# Patient Record
Sex: Female | Born: 1975 | Race: White | Hispanic: No | Marital: Single | Smoking: Never smoker
Health system: Southern US, Community
[De-identification: ages and names within clinical notes are randomized; demographics above are authoritative.]

## PROBLEM LIST (undated history)

## (undated) ENCOUNTER — Inpatient Hospital Stay: Payer: Self-pay

## (undated) DIAGNOSIS — I10 Essential (primary) hypertension: Secondary | ICD-10-CM

## (undated) DIAGNOSIS — T4145XA Adverse effect of unspecified anesthetic, initial encounter: Secondary | ICD-10-CM

## (undated) DIAGNOSIS — B009 Herpesviral infection, unspecified: Secondary | ICD-10-CM

## (undated) DIAGNOSIS — J45909 Unspecified asthma, uncomplicated: Secondary | ICD-10-CM

## (undated) DIAGNOSIS — T8859XA Other complications of anesthesia, initial encounter: Secondary | ICD-10-CM

## (undated) DIAGNOSIS — Z87728 Personal history of other specified (corrected) congenital malformations of nervous system and sense organs: Secondary | ICD-10-CM

## (undated) HISTORY — DX: Unspecified asthma, uncomplicated: J45.909

## (undated) HISTORY — PX: NASAL SINUS SURGERY: SHX719

## (undated) HISTORY — DX: Personal history of other specified (corrected) congenital malformations of nervous system and sense organs: Z87.728

## (undated) HISTORY — DX: Herpesviral infection, unspecified: B00.9

---

## 2008-06-22 ENCOUNTER — Encounter: Payer: Self-pay | Admitting: Nurse Practitioner

## 2008-07-07 ENCOUNTER — Encounter: Payer: Self-pay | Admitting: Nurse Practitioner

## 2008-08-01 ENCOUNTER — Ambulatory Visit: Payer: Self-pay | Admitting: Family Medicine

## 2008-08-17 ENCOUNTER — Ambulatory Visit: Payer: Self-pay | Admitting: Sports Medicine

## 2008-08-17 DIAGNOSIS — M2170 Unequal limb length (acquired), unspecified site: Secondary | ICD-10-CM

## 2008-08-17 DIAGNOSIS — R269 Unspecified abnormalities of gait and mobility: Secondary | ICD-10-CM | POA: Insufficient documentation

## 2008-08-17 DIAGNOSIS — M25569 Pain in unspecified knee: Secondary | ICD-10-CM | POA: Insufficient documentation

## 2008-08-20 ENCOUNTER — Encounter: Payer: Self-pay | Admitting: Sports Medicine

## 2008-09-08 ENCOUNTER — Ambulatory Visit: Payer: Self-pay | Admitting: Sports Medicine

## 2008-11-18 ENCOUNTER — Telehealth (INDEPENDENT_AMBULATORY_CARE_PROVIDER_SITE_OTHER): Payer: Self-pay | Admitting: *Deleted

## 2011-09-26 ENCOUNTER — Encounter: Payer: Self-pay | Admitting: Sports Medicine

## 2011-09-26 ENCOUNTER — Ambulatory Visit (INDEPENDENT_AMBULATORY_CARE_PROVIDER_SITE_OTHER): Payer: BC Managed Care – PPO | Admitting: Sports Medicine

## 2011-09-26 VITALS — BP 136/81 | HR 64 | Ht 64.0 in | Wt 130.0 lb

## 2011-09-26 DIAGNOSIS — M25552 Pain in left hip: Secondary | ICD-10-CM | POA: Insufficient documentation

## 2011-09-26 DIAGNOSIS — R269 Unspecified abnormalities of gait and mobility: Secondary | ICD-10-CM

## 2011-09-26 DIAGNOSIS — M25559 Pain in unspecified hip: Secondary | ICD-10-CM

## 2011-09-26 MED ORDER — NITROGLYCERIN 0.2 MG/HR TD PT24: MEDICATED_PATCH | TRANSDERMAL | Status: DC

## 2011-09-26 NOTE — Patient Instructions (Addendum)
Thank you for coming in today. Exercises: 1) Hip Lateral abductors                              15 reps 3 sets 2x a day 2) Hip flexed 30 deg lateral abductors         15 reps 3 sets 2x a day 3) Front step ups                                         15 reps 3 sets 2x a day 3) Side steps ups     15 reps 3 sets 2x a day 4) Cross over step ups   15 reps 3 sets 2x a day  Wear those insoles  Nitroglycerin Protocol   Apply 1/4 nitroglycerin patch to affected area daily.  Change position of patch within the affected area every 24 hours.  You may experience a headache during the first 1-2 weeks of using the patch, these should subside.  If you experience headaches after beginning nitroglycerin patch treatment, you may take your preferred over the counter pain reliever.  Another side effect of the nitroglycerin patch is skin irritation or rash related to patch adhesive.  Please notify our office if you develop more severe headaches or rash, and stop the patch.  Tendon healing with nitroglycerin patch may require 12 to 24 weeks depending on the extent of injury.  Men should not use if taking Viagra, Cialis, or Levitra.   Do not use if you have migraines or rosacea.    Come back in 4-6 weeks

## 2011-09-26 NOTE — Assessment & Plan Note (Signed)
Related to strain of tensor fascia insertion.  Additionally abnormal leg length is contributing.  Plan:  Nitroglycerin protocol Hip abductor strengthening Graduated return to running.  Followup in 4-6 weeks

## 2011-09-26 NOTE — Assessment & Plan Note (Signed)
We may have overcorrected left leg  Try wedge insole and in office running gait is more normal with less turn in  Line drills

## 2011-09-26 NOTE — Progress Notes (Signed)
Sydney Lynn is a 36 y.o. female who presents to [facility] today for left iliac crest pain. Patient is a 36 year old runner with a history of leg length discrepancy (left 0.5 cm shorter) who presented with iliac crest pain about 5 weeks ago. She denies any specific injury. However her pain developed after adjusting stride length and doing speed work in July. She notes her pain is worse with running especially up and down hills. She is no longer running her typical 30 miles per week.  She has continued to use a leg length correction in her shoes.  She feels well otherwise without any weakness numbness or radiating pain.   PMH reviewed. Leg length discrepancy History  Substance Use Topics  . Smoking status: Never Smoker   . Smokeless tobacco: Never Used  . Alcohol Use: Not on file   ROS as above otherwise neg   Exam:  BP 136/81  Pulse 64  Ht 5\' 4"  (1.626 m)  Wt 130 lb (58.968 kg)  BMI 22.31 kg/m2 Gen: Well NAD MSK: Left hip. Normal-appearing. Tender along the iliac crest Center tensor fascia lata origin.  Normal hip range of motion.  Strength is diminished to abduction bilaterally.  Normal hip abduction strength and flexion strength.   0.5 cm leg length discrepancy left shorter.  Feet: Relatively normal appearing left foot with bunion. No significant arch collapse. Normal heel varus on toe standing bilaterally.   Musculoskeletal ultrasound: Iliac crest.  Small area of hypo-echogenicity seen near the insertion of the tensor fascia on the iliac crest in longitudinal and transverse views. Increased Doppler flow within the hypo-echogenicity  Running gait is improved with changing to a wedged insole

## 2011-11-07 ENCOUNTER — Ambulatory Visit: Payer: BC Managed Care – PPO | Admitting: Sports Medicine

## 2011-11-12 ENCOUNTER — Encounter: Payer: Self-pay | Admitting: Sports Medicine

## 2011-11-12 ENCOUNTER — Ambulatory Visit (INDEPENDENT_AMBULATORY_CARE_PROVIDER_SITE_OTHER): Payer: BC Managed Care – PPO | Admitting: Sports Medicine

## 2011-11-12 VITALS — BP 122/76 | HR 64 | Ht 64.0 in | Wt 130.0 lb

## 2011-11-12 DIAGNOSIS — M25552 Pain in left hip: Secondary | ICD-10-CM

## 2011-11-12 DIAGNOSIS — M25559 Pain in unspecified hip: Secondary | ICD-10-CM

## 2011-11-12 DIAGNOSIS — R269 Unspecified abnormalities of gait and mobility: Secondary | ICD-10-CM

## 2011-11-12 DIAGNOSIS — M2170 Unequal limb length (acquired), unspecified site: Secondary | ICD-10-CM

## 2011-11-12 NOTE — Assessment & Plan Note (Signed)
She has corrected the dynamic genu valgus

## 2011-11-12 NOTE — Assessment & Plan Note (Signed)
Much improved  Cont program for 6 more weeks  If not resolved rtc at that point - otherwise cont some HEP for hip strength

## 2011-11-12 NOTE — Progress Notes (Signed)
Patient ID: Sydney Lynn, female   DOB: 07/14/1975, 36 y.o.   MRN: [id number]  SUBJECTIVE: Sydney Lynn is a 36 y.o. female with unremarkable PMHx who was last seen in [facility] on 19-Sept-2013 for LEFT iliac crest pain, ultimately diagnosed with Tensor Fascia Lata strain and discharged with home hip rehabilitation strengthening exercises, nitro glycerin protocol and gradual return to running.  She reports using nitroglycerin patches for approx 2 weeks but having to stop secondary to localized erythema, scaling and intense pruritis.  However, she feels that she has improved to approx 80-85% of her pre-injury level of activity / function.  She reports doing home exercises faithfully (focusing on hip abduction, adduction strengthening) and gradually returned to running.  She was also given green, temporary orthotic inserts which she has found beneficial as well.  Remarks that at the end of her runs she now feels as if her knees are knocking / rubbing together.    Now able to run 5 miles on treadmill, 2 consecutive days with little pain (only occasional during the end of runs @ TFL insertion).  She has had difficulty with training outside, especially given [place] and only tried running 4 miles outside on two different occasions.  Both caused pain and required 2 days off after 1 day of running.  Pre-injury she ran approx 30-35 miles per week, long run of 8 and ran 10Ks and half-marathons.  OBJECTIVE: Vital signs as noted above. Appearance: Well appearing, fit female, resting comfortably  Bilateral Hip / Knee exam: Inspection - No gross abnormalities or erythema, edema, echhymosis in bilateral hips/knees AROM, PROM - Symmetrica and appropriate ROM in knee flexion, extension and in hip flexion, extension, IR, ER, abduction, adduction. Muscle Strength -  5/5 strength in all of the above including hip abduction at terminal abduction (70 degrees), 30 degrees abduction knee straight (some  pain @ TFL) and 30 degrees knee bent (some pain @ TFL insertion, unchanged compared to straight).  5/5 strength in bilateral knees and all other hip ROM. Palpation - Some localized tenderness along inferior lip of lateral portion of anterior 1/3 of iliac crest on LEFT side, worse when palpating during resisted hip abduction.  Non-tender to ASIS, PSIS, apex and distal 2/3 of iliac crest  Special Tests - Negative Faber's test bilaterally  Standing foot exam: Feet appear symmetric and appropriate;  Good medial longitudinal and transverse arches bilaterally.  No appreciable hindfoot valgus or achilles abnormalities.  Distance between toes symmetric and appropriate bilaterally.  Gait evaluation: Pt runs well with appropriate time in stance and swing phase bilaterally.  Neutral footstrike.  No appreciable dynamic genu valgus or trendelenburg on the LEFT leg which is a significant improvement compared to prior visit.  ASSESSMENT: 1. Tensor Fascia Lata Strain, LEFT, improving   PLAN: Pt with history and exam above to support improving, healing LEFT Tensor Fascia Latta strain.  She will continue gradual return to running with slow return to running on hills / uneven surfaces and outside.  She will continue hip abduction/adduction strengthening 1-2 x per week when she is running competitively.  Cleared to return to running with increased mileage on flat surface (tracks or treadmills) and will gradually return to hills with decreased mileage, pace initially and increase as tolerated.  She will follow-up as needed for this issue or any other problems that arise.    -- Kenney Houseman, MS IV

## 2011-11-12 NOTE — Assessment & Plan Note (Signed)
Continue to use lift but not the heel taper

## 2013-12-17 ENCOUNTER — Encounter: Payer: Self-pay | Admitting: Sports Medicine

## 2013-12-17 ENCOUNTER — Ambulatory Visit (INDEPENDENT_AMBULATORY_CARE_PROVIDER_SITE_OTHER): Payer: BC Managed Care – PPO | Admitting: Sports Medicine

## 2013-12-17 VITALS — BP 112/80 | HR 69 | Ht 64.0 in | Wt 130.0 lb

## 2013-12-17 DIAGNOSIS — M545 Low back pain, unspecified: Secondary | ICD-10-CM | POA: Insufficient documentation

## 2013-12-17 DIAGNOSIS — M2170 Unequal limb length (acquired), unspecified site: Secondary | ICD-10-CM | POA: Diagnosis not present

## 2013-12-17 DIAGNOSIS — R269 Unspecified abnormalities of gait and mobility: Secondary | ICD-10-CM | POA: Diagnosis not present

## 2013-12-17 NOTE — Assessment & Plan Note (Signed)
We will work toward biomechanical correction  If not improving ck LS spine films and try flexeril  Reck 8 yrs

## 2013-12-17 NOTE — Progress Notes (Signed)
Patient ID: Sydney Lynn, female   DOB: 07/28/1975, 38 y.o.   MRN: [id number]  1 yr ago lifting had low back pain to left lumbar Dx of spina bifida in childhood Prior to injury rare LBP but not like this  PE teacher elementary  Runs ~ 25 mpw Speed and longer distance cause pain  Hx of hip problem and LLI when we last saw her  Exam Athletic F/ NAD BP 112/80 mmHg  Pulse 69  Ht 5\' 4"  (1.626 m)  Wt 130 lb (58.968 kg)  BMI 22.30 kg/m2  Hip Full ROM bilat/ weakness of hip abductios L > R/ other MM strong  Back ; Full extension with mild pain to left SIJ Flexion full with no pain  Lat bend/ rotation normal SIJ move freely Neg Faber/ FADIR No discogenic findings w neg SLR  HS moderate weakness on left  LLI with left 1 to 1.5 cm short  Running gait with 5 deg lean of trunk to left/ otherwise exc form (even w lift)

## 2013-12-17 NOTE — Assessment & Plan Note (Signed)
Work toward [employer]rection  Start with core exercises and hip exercises

## 2013-12-17 NOTE — Assessment & Plan Note (Signed)
Cont to use lift in all shoes

## 2014-02-15 ENCOUNTER — Ambulatory Visit: Payer: BC Managed Care – PPO | Admitting: Sports Medicine

## 2014-07-07 ENCOUNTER — Ambulatory Visit (INDEPENDENT_AMBULATORY_CARE_PROVIDER_SITE_OTHER): Payer: BC Managed Care – PPO | Admitting: Obstetrics and Gynecology

## 2014-07-07 ENCOUNTER — Encounter: Payer: Self-pay | Admitting: Obstetrics and Gynecology

## 2014-07-07 VITALS — BP 127/72 | HR 68 | Wt 131.9 lb

## 2014-07-07 DIAGNOSIS — Z36 Encounter for antenatal screening of mother: Secondary | ICD-10-CM

## 2014-07-07 DIAGNOSIS — Z91030 Bee allergy status: Secondary | ICD-10-CM

## 2014-07-07 DIAGNOSIS — O3680X1 Pregnancy with inconclusive fetal viability, fetus 1: Secondary | ICD-10-CM

## 2014-07-07 DIAGNOSIS — Z369 Encounter for antenatal screening, unspecified: Secondary | ICD-10-CM

## 2014-07-07 DIAGNOSIS — Z91038 Other insect allergy status: Secondary | ICD-10-CM

## 2014-07-07 LAB — OB RESULTS CONSOLE ABO/RH: RH Type: NEGATIVE

## 2014-07-08 ENCOUNTER — Encounter: Payer: Self-pay | Admitting: Obstetrics and Gynecology

## 2014-07-08 LAB — ABO AND RH: RH TYPE: NEGATIVE

## 2014-07-08 LAB — CBC WITH DIFFERENTIAL/PLATELET
BASOS ABS: 0 10*3/uL (ref 0.0–0.2)
BASOS: 0 %
EOS (ABSOLUTE): 0.1 10*3/uL (ref 0.0–0.4)
EOS: 1 %
HEMATOCRIT: 40.3 % (ref 34.0–46.6)
HEMOGLOBIN: 13.8 g/dL (ref 11.1–15.9)
IMMATURE GRANULOCYTES: 0 %
Immature Grans (Abs): 0 10*3/uL (ref 0.0–0.1)
LYMPHS: 23 %
Lymphocytes Absolute: 1.8 10*3/uL (ref 0.7–3.1)
MCH: 31.7 pg (ref 26.6–33.0)
MCHC: 34.2 g/dL (ref 31.5–35.7)
MCV: 93 fL (ref 79–97)
MONOCYTES: 7 %
Monocytes Absolute: 0.6 10*3/uL (ref 0.1–0.9)
NEUTROS PCT: 69 %
Neutrophils Absolute: 5.5 10*3/uL (ref 1.4–7.0)
PLATELETS: 294 10*3/uL (ref 150–379)
RBC: 4.35 x10E6/uL (ref 3.77–5.28)
RDW: 12.6 % (ref 12.3–15.4)
WBC: 8.1 10*3/uL (ref 3.4–10.8)

## 2014-07-08 LAB — ANTIBODY SCREEN: ANTIBODY SCREEN: NEGATIVE

## 2014-07-08 LAB — URINE CULTURE

## 2014-07-08 LAB — RUBELLA SCREEN: RUBELLA: 1.03 {index} (ref >0.99)

## 2014-07-08 LAB — URINALYSIS, ROUTINE W REFLEX MICROSCOPIC
BILIRUBIN UA: NEGATIVE
Glucose, UA: NEGATIVE
KETONES UA: NEGATIVE
Leukocytes, UA: NEGATIVE
NITRITE UA: NEGATIVE
PROTEIN UA: NEGATIVE
RBC UA: NEGATIVE
SPEC GRAV UA: 1.029 (ref 1.005–1.030)
UUROB: 0.2 mg/dL (ref 0.2–1.0)
pH, UA: 7 (ref 5.0–7.5)

## 2014-07-08 LAB — VARICELLA ZOSTER ANTIBODY, IGG: Varicella zoster IgG: 730 index (ref >165)

## 2014-07-08 LAB — HEP, RPR, HIV PANEL
HEP B S AG: NEGATIVE
HIV SCREEN 4TH GENERATION: NONREACTIVE
RPR: NONREACTIVE

## 2014-07-08 MED ORDER — EPINEPHRINE 0.3 MG/0.3ML IJ SOAJ: 0.3000 mg | Freq: Once | INTRAMUSCULAR | Status: DC

## 2014-07-08 NOTE — Addendum Note (Signed)
Addended by: Frankey ShownGLANTON, Calea Hribar C on: 07/08/2014 09:53 AM   Modules accepted: Orders

## 2014-07-08 NOTE — Patient Instructions (Signed)
Pt to return in 1 week for dating and viability scan. F/U in 3 weeks for NOB PE.

## 2014-07-08 NOTE — Progress Notes (Cosign Needed)
[facility] G[facility]  P0 Pregnancy eduction material explained and given. NOB labs ordered. HIV consent form signed. PNV encouraged. Pt encouraged to to take 4 grams of Folic Acid as pt has family history of Spina Bifida (Father and Brother), as well as personal history of Spina Bifida. Pt history also significant for HSV 2, uses Valtrex for outbreaks. Pt is of advanced maternal age. NT to discuss with provider. Pt. To follow up with provider in 3 weeks for NOB physical. Pt to have dating and viability scan in 1 week. All questions answered.

## 2014-07-08 NOTE — Progress Notes (Signed)
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 Will send RX.

## 2014-07-15 ENCOUNTER — Ambulatory Visit: Payer: BC Managed Care – PPO

## 2014-07-15 DIAGNOSIS — O3680X1 Pregnancy with inconclusive fetal viability, fetus 1: Secondary | ICD-10-CM | POA: Diagnosis not present

## 2014-07-28 ENCOUNTER — Ambulatory Visit (INDEPENDENT_AMBULATORY_CARE_PROVIDER_SITE_OTHER): Payer: BC Managed Care – PPO | Admitting: Obstetrics and Gynecology

## 2014-07-28 ENCOUNTER — Encounter: Payer: Self-pay | Admitting: Obstetrics and Gynecology

## 2014-07-28 VITALS — BP 117/72 | HR 70 | Wt 137.0 lb

## 2014-07-28 DIAGNOSIS — O98511 Other viral diseases complicating pregnancy, first trimester: Secondary | ICD-10-CM

## 2014-07-28 DIAGNOSIS — B009 Herpesviral infection, unspecified: Secondary | ICD-10-CM

## 2014-07-28 DIAGNOSIS — Z3481 Encounter for supervision of other normal pregnancy, first trimester: Secondary | ICD-10-CM

## 2014-07-28 DIAGNOSIS — Z113 Encounter for screening for infections with a predominantly sexual mode of transmission: Secondary | ICD-10-CM

## 2014-07-28 DIAGNOSIS — Z3491 Encounter for supervision of normal pregnancy, unspecified, first trimester: Secondary | ICD-10-CM

## 2014-07-28 DIAGNOSIS — Z8776 Personal history of (corrected) congenital malformations of integument, limbs and musculoskeletal system: Secondary | ICD-10-CM

## 2014-07-28 DIAGNOSIS — Z87728 Personal history of other specified (corrected) congenital malformations of nervous system and sense organs: Secondary | ICD-10-CM

## 2014-07-28 LAB — POCT URINALYSIS DIPSTICK
Bilirubin, UA: NEGATIVE
Glucose, UA: NEGATIVE
Ketones, UA: NEGATIVE
Leukocytes, UA: NEGATIVE
NITRITE UA: NEGATIVE
Protein, UA: NEGATIVE
RBC UA: NEGATIVE
Spec Grav, UA: 1.01
UROBILINOGEN UA: NEGATIVE
pH, UA: 8

## 2014-07-28 MED ORDER — VALACYCLOVIR HCL 1 G PO TABS: 1000.0000 mg | ORAL_TABLET | Freq: Every day | ORAL | Status: DC

## 2014-07-28 MED ORDER — ALBUTEROL SULFATE HFA 108 (90 BASE) MCG/ACT IN AERS: 2.0000 | INHALATION_SPRAY | RESPIRATORY_TRACT | Status: DC | PRN

## 2014-07-29 NOTE — Progress Notes (Addendum)
Subjective:    Sydney Lynn is being seen today for her first obstetrical visit.  This is a planned pregnancy. She is a G1P0 at [redacted]w[redacted]d gestation by Patient's last menstrual period was 05/21/2014.  Her obstetrical history is significant for advanced maternal age and h/o spina bifida (type 1). Relationship with FOB: spouse, living together. Patient does intend to breast feed. Pregnancy history fully reviewed.  Menstrual History: OB History    Gravida Para Term Preterm AB TAB SAB Ectopic Multiple Living   1               Menarche age: 64 Patient's last menstrual period was 05/21/2014.  Denies h/o abnormal pap smears. Last pap smear normal, 2015.     Past Medical History  Diagnosis Date  . Asthma     Exercise Induced  . 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 HSV-2 infection   . H/O spina bifida     mild    Past Surgical History  Procedure Laterality Date  . Nasal sinus surgery      Family History  Problem Relation Age of Onset  . Hypertension Mother   . Spina bifida Father     mild  . Spina bifida Brother     Type 1    History   Social History  . Marital Status: Single    Spouse Name: N/A  . Number of Children: N/A  . Years of Education: N/A   Occupational History  . Not on file.   Social History Main Topics  . Smoking status: Never Smoker   . Smokeless tobacco: Never Used  . Alcohol Use: No  . Drug Use: No  . Sexual Activity: Yes    Birth Control/ Protection: None     Comment: Pregnant   Other Topics Concern  . Not on file   Social History Narrative    Current Outpatient Prescriptions on File Prior to Visit  Medication Sig Dispense Refill  . cetirizine (ZYRTEC) 5 MG tablet Take 5 mg by mouth daily.    Marland Kitchen EPINEPHrine (EPIPEN 2-PAK) 0.3 mg/0.3 mL IJ SOAJ injection Inject 0.3 mLs (0.3 mg total) into the muscle once. 1 Device 3  . triamcinolone (NASACORT) 55 MCG/ACT nasal inhaler Inhale 2 puffs into the lungs daily.        Review of Systems  General:Not Present- Fever, Weight Loss and Weight Gain. Skin:Not Present- Rash. HEENT:Not Present- Blurred Vision, Headache and Bleeding Gums. Respiratory:Not Present- Difficulty Breathing. Breast:Not Present- Breast Mass. Cardiovascular:Not Present- Chest Pain, Elevated Blood Pressure, Fainting / Blacking Out and Shortness of Breath. Gastrointestinal:Not Present- Abdominal Pain, Constipation, Nausea and Vomiting. Female Genitourinary:Not Present- Frequency, Painful Urination, Pelvic Pain, Vaginal Bleeding, Vaginal Discharge, Contractions, regular, Fetal Movements Decreased, Urinary Complaints and Vaginal Fluid. Musculoskeletal:Not Present- Back Pain and Leg Cramps. Neurological:Not Present- Dizziness. Psychiatric:Not Present- Depression.    Objective:   Blood pressure 117/72, pulse 70, weight 137 lb (62.143 kg), last menstrual period 05/21/2014.  General Appearance:    Alert, cooperative, no distress, appears stated age  Head:    Normocephalic, without obvious abnormality, atraumatic  Eyes:    PERRL, conjunctiva/corneas clear, EOM's intact, both eyes  Ears:    Normal external ear canals, both ears  Nose:   Nares normal, septum midline, mucosa normal, no drainage or sinus tenderness  Throat:   Lips, mucosa, and tongue normal; teeth and gums normal  Neck:   Supple, symmetrical, trachea midline, no adenopathy; thyroid: no enlargement/tenderness/nodules; no  carotid bruit or JVD  Back:     Symmetric, no curvature, ROM normal, no CVA tenderness  Lungs:     Clear to auscultation bilaterally, respirations unlabored  Chest Wall:    No tenderness or deformity   Heart:    Regular rate and rhythm, S1 and S2 normal, no murmur, rub or gallop  Breast Exam:    No tenderness, masses, or nipple abnormality  Abdomen:     Soft, non-tender, bowel sounds active all four quadrants, no masses, no organomegaly. FHT 162 bpm.  Genitalia:    Pelvic:external genitalia  normal, vagina with lesions, discharge, or tenderness, rectovaginal septum       normal. Cervix normal in appearance, no cervical motion tenderness, no adnexal masses or tenderness.     Pregnancy positive findings: uterine enlargement:  FH 11 wk size, nontender.   Rectal:    Normal external sphincter.  No hemorrhoids appreciated. Internal exam not done.   Extremities:   Extremities normal, atraumatic, no cyanosis or edema  Pulses:   2+ and symmetric all extremities  Skin:   Skin color, texture, turgor normal, no rashes or lesions  Lymph nodes:   Cervical, supraclavicular, and axillary nodes normal  Neurologic:   CNII-XII intact, normal strength, sensation and reflexes throughout     Assessment:    Pregnancy at 11 and 0/7 weeks   Advanced maternal age. 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 Prenatal vitamins. Problem list reviewed and updated. Genetic testing discussed.  Patient desires Panorama testing.  Will perform today.  Role of ultrasound in pregnancy discussed; fetal survey: to order for appropriate gestational age. New OB counseling:  The patient has been given an overview regarding routine prenatal care.  Recommendations regarding diet, weight gain, and exercise in pregnancy were given.  Prenatal testing, optional genetic testing, and ultrasound use in pregnancy were reviewed. Benefits of Breast Feeding were discussed. The patient is encouraged to consider nursing her baby post partum. Desires refill on asthma inhaler.  Refill given.  H/o HSV infection - last outbreak 1-2 years ago.  Takes suppressive therapy.  Desires refill.  Will continue on suppressive therapy while in pregnancy.  H/o spina bifida - has been taking 4 mg folic acid daily since discovery of pregnancy.  Follow up in 4 weeks.    Hildred Laser, MD [facility]

## 2014-08-02 ENCOUNTER — Telehealth: Payer: Self-pay

## 2014-08-02 DIAGNOSIS — N760 Acute vaginitis: Secondary | ICD-10-CM

## 2014-08-02 DIAGNOSIS — Z91030 Bee allergy status: Secondary | ICD-10-CM

## 2014-08-02 DIAGNOSIS — B9689 Other specified bacterial agents as the cause of diseases classified elsewhere: Secondary | ICD-10-CM

## 2014-08-02 LAB — NUSWAB VAGINITIS PLUS (VG+)
ATOPOBIUM VAGINAE: HIGH {score} — AB
BVAB 2: HIGH Score — AB
MEGASPHAERA 1: HIGH {score} — AB

## 2014-08-02 MED ORDER — EPINEPHRINE 0.3 MG/0.3ML IJ SOAJ: 0.3000 mg | Freq: Once | INTRAMUSCULAR | Status: AC

## 2014-08-02 MED ORDER — METRONIDAZOLE 500 MG PO TABS: 500.0000 mg | ORAL_TABLET | Freq: Two times a day (BID) | ORAL | Status: DC

## 2014-08-02 NOTE — Telephone Encounter (Signed)
Pt informed of BV, RX sent in for Flagyl.

## 2014-08-02 NOTE — Telephone Encounter (Signed)
-----   Message from Hildred Laser, MD sent at 08/02/2014 11:32 AM EDT ----- Needs treatment for BV (Flagyl 500 mg BID x 7 days).

## 2014-08-05 ENCOUNTER — Encounter: Payer: Self-pay | Admitting: Obstetrics and Gynecology

## 2014-08-08 ENCOUNTER — Encounter: Payer: Self-pay | Admitting: Obstetrics and Gynecology

## 2014-08-10 ENCOUNTER — Telehealth: Payer: Self-pay

## 2014-08-10 NOTE — Telephone Encounter (Signed)
Pt informed of information below

## 2014-08-10 NOTE — Telephone Encounter (Signed)
-----   Message from Hildred Laser, MD sent at 08/09/2014  2:18 PM EDT ----- Please inform patient of normal Panorama results, female fetus if desires to know sex.

## 2014-08-25 ENCOUNTER — Ambulatory Visit (INDEPENDENT_AMBULATORY_CARE_PROVIDER_SITE_OTHER): Payer: BC Managed Care – PPO | Admitting: Obstetrics and Gynecology

## 2014-08-25 ENCOUNTER — Encounter: Payer: Self-pay | Admitting: Obstetrics and Gynecology

## 2014-08-25 VITALS — BP 110/71 | HR 70 | Wt 139.2 lb

## 2014-08-25 DIAGNOSIS — Z331 Pregnant state, incidental: Secondary | ICD-10-CM

## 2014-08-25 DIAGNOSIS — Z8776 Personal history of (corrected) congenital malformations of integument, limbs and musculoskeletal system: Secondary | ICD-10-CM

## 2014-08-25 DIAGNOSIS — Z87728 Personal history of other specified (corrected) congenital malformations of nervous system and sense organs: Secondary | ICD-10-CM

## 2014-08-25 LAB — POCT URINALYSIS DIPSTICK
Bilirubin, UA: NEGATIVE
GLUCOSE UA: NEGATIVE
Ketones, UA: NEGATIVE
Leukocytes, UA: NEGATIVE
NITRITE UA: NEGATIVE
PH UA: 7.5
PROTEIN UA: NEGATIVE
RBC UA: NEGATIVE
Spec Grav, UA: 1.01
UROBILINOGEN UA: 0.2

## 2014-08-25 NOTE — Progress Notes (Signed)
ROB: Patient doing well, notes complaints of hemorrhoids.  Denies constipation.  Advised on Preparation H, Tucks pads.  RTC in 5 weeks.  Will need serum AFP and anatomy scan next visit. Continue folic acid supplementation for h/o spina bifida.

## 2014-09-28 ENCOUNTER — Other Ambulatory Visit: Payer: Self-pay | Admitting: Obstetrics and Gynecology

## 2014-09-28 ENCOUNTER — Ambulatory Visit (INDEPENDENT_AMBULATORY_CARE_PROVIDER_SITE_OTHER): Payer: BC Managed Care – PPO | Admitting: Obstetrics and Gynecology

## 2014-09-28 ENCOUNTER — Encounter: Payer: Self-pay | Admitting: Obstetrics and Gynecology

## 2014-09-28 ENCOUNTER — Ambulatory Visit: Payer: BC Managed Care – PPO

## 2014-09-28 VITALS — BP 113/67 | HR 66 | Wt 144.9 lb

## 2014-09-28 DIAGNOSIS — Z3493 Encounter for supervision of normal pregnancy, unspecified, third trimester: Secondary | ICD-10-CM | POA: Insufficient documentation

## 2014-09-28 DIAGNOSIS — Z331 Pregnant state, incidental: Secondary | ICD-10-CM

## 2014-09-28 DIAGNOSIS — Z8776 Personal history of (corrected) congenital malformations of integument, limbs and musculoskeletal system: Secondary | ICD-10-CM

## 2014-09-28 DIAGNOSIS — Z87728 Personal history of other specified (corrected) congenital malformations of nervous system and sense organs: Secondary | ICD-10-CM

## 2014-09-28 DIAGNOSIS — Z3492 Encounter for supervision of normal pregnancy, unspecified, second trimester: Secondary | ICD-10-CM | POA: Diagnosis not present

## 2014-09-28 DIAGNOSIS — O09512 Supervision of elderly primigravida, second trimester: Secondary | ICD-10-CM

## 2014-09-28 LAB — POCT URINALYSIS DIPSTICK
BILIRUBIN UA: NEGATIVE
Blood, UA: NEGATIVE
GLUCOSE UA: NEGATIVE
Ketones, UA: NEGATIVE
Leukocytes, UA: NEGATIVE
Nitrite, UA: NEGATIVE
Protein, UA: NEGATIVE
Urobilinogen, UA: 0.2
pH, UA: 6.5

## 2014-09-28 MED ORDER — INFLUENZA VAC SPLIT QUAD 0.5 ML IM SUSY
0.5000 mL | PREFILLED_SYRINGE | Freq: Once | INTRAMUSCULAR | Status: AC
  Administered 2014-09-28: 0.5 mL via INTRAMUSCULAR

## 2014-09-28 NOTE — Progress Notes (Signed)
AFP today. Flu vaccine today.

## 2014-09-28 NOTE — Progress Notes (Signed)
ROB: Doing well. Denies complaints. S/p normal anatomy scan.  For serum AFP today.  Flu vaccine given.  RTC in 4 weeks.

## 2014-10-01 LAB — AFP, SERUM, OPEN SPINA BIFIDA
AFP MoM: 0.98
AFP VALUE AFPOSL: 51.8 ng/mL
Gest. Age on Collection Date: 19.7 weeks
MATERNAL AGE AT EDD: 37.4 a
OSBR Risk 1 IN: 10000
Test Results:: NEGATIVE
WEIGHT: 150 [lb_av]

## 2014-10-25 ENCOUNTER — Ambulatory Visit (INDEPENDENT_AMBULATORY_CARE_PROVIDER_SITE_OTHER): Payer: BC Managed Care – PPO | Admitting: Obstetrics and Gynecology

## 2014-10-25 VITALS — BP 138/77 | HR 76 | Wt 153.5 lb

## 2014-10-25 DIAGNOSIS — Z8776 Personal history of (corrected) congenital malformations of integument, limbs and musculoskeletal system: Secondary | ICD-10-CM

## 2014-10-25 DIAGNOSIS — Z87728 Personal history of other specified (corrected) congenital malformations of nervous system and sense organs: Secondary | ICD-10-CM

## 2014-10-25 DIAGNOSIS — Z6791 Unspecified blood type, Rh negative: Secondary | ICD-10-CM | POA: Insufficient documentation

## 2014-10-25 DIAGNOSIS — Z3492 Encounter for supervision of normal pregnancy, unspecified, second trimester: Secondary | ICD-10-CM

## 2014-10-25 DIAGNOSIS — O26899 Other specified pregnancy related conditions, unspecified trimester: Secondary | ICD-10-CM

## 2014-10-25 DIAGNOSIS — O360120 Maternal care for anti-D [Rh] antibodies, second trimester, not applicable or unspecified: Secondary | ICD-10-CM

## 2014-10-25 LAB — POCT URINALYSIS DIP (MANUAL ENTRY)
BILIRUBIN UA: NEGATIVE
Blood, UA: NEGATIVE
GLUCOSE UA: NEGATIVE
Ketones, POC UA: NEGATIVE
LEUKOCYTES UA: NEGATIVE
Nitrite, UA: NEGATIVE
PH UA: 7
Protein Ur, POC: NEGATIVE
Spec Grav, UA: 1.01
Urobilinogen, UA: 0.2

## 2014-10-25 NOTE — Progress Notes (Signed)
ROB: Denies complaints.  Notes that sometimes when running, feels groin pain.  Eases after cessation of run.  May be round ligament pain and/or pain related to exercise. Advised on pregnancy band, staying hydrated, Tylenol prn. Serum AFP neg. RTC in 4 weeks for next visit.  For glucola, Tdap, Rhogam, and blood consent at that time.  .Marland Kitchen

## 2014-10-25 NOTE — Patient Instructions (Signed)
Round Ligament Pain  The round ligament is a cord of muscle and tissue that helps to support the uterus. It can become a source of pain during pregnancy if it becomes stretched or twisted as the baby grows. The pain usually begins in the second trimester of pregnancy, and it can come and go until the baby is delivered. It is not a serious problem, and it does not cause harm to the baby.  Round ligament pain is usually a short, sharp, and pinching pain, but it can also be a dull, lingering, and aching pain. The pain is felt in the lower side of the abdomen or in the groin. It usually starts deep in the groin and moves up to the outside of the hip area. Pain can occur with:   A sudden change in position.   Rolling over in bed.   Coughing or sneezing.   Physical activity.  HOME CARE INSTRUCTIONS  Watch your condition for any changes. Take these steps to help with your pain:   When the pain starts, relax. Then try:    Sitting down.    Flexing your knees up to your abdomen.    Lying on your side with one pillow under your abdomen and another pillow between your legs.    Sitting in a warm bath for 15-20 minutes or until the pain goes away.   Take over-the-counter and prescription medicines only as told by your health care provider.   Move slowly when you sit and stand.   Avoid long walks if they cause pain.   Stop or lessen your physical activities if they cause pain.  SEEK MEDICAL CARE IF:   Your pain does not go away with treatment.   You feel pain in your back that you did not have before.   Your medicine is not helping.  SEEK IMMEDIATE MEDICAL CARE IF:   You develop a fever or chills.   You develop uterine contractions.   You develop vaginal bleeding.   You develop nausea or vomiting.   You develop diarrhea.   You have pain when you urinate.     This information is not intended to replace advice given to you by your health care provider. Make sure you discuss any questions you have with your health  care provider.     Document Released: 10/03/2007 Document Revised: 03/18/2011 Document Reviewed: 03/02/2014  Elsevier Interactive Patient Education 2016 [employer]

## 2014-11-22 ENCOUNTER — Encounter: Payer: BC Managed Care – PPO | Admitting: Obstetrics and Gynecology

## 2014-11-30 ENCOUNTER — Other Ambulatory Visit: Payer: BC Managed Care – PPO

## 2014-11-30 ENCOUNTER — Ambulatory Visit (INDEPENDENT_AMBULATORY_CARE_PROVIDER_SITE_OTHER): Payer: BC Managed Care – PPO | Admitting: Obstetrics and Gynecology

## 2014-11-30 VITALS — BP 124/75 | HR 66 | Wt 161.3 lb

## 2014-11-30 DIAGNOSIS — O09512 Supervision of elderly primigravida, second trimester: Secondary | ICD-10-CM

## 2014-11-30 DIAGNOSIS — Z3493 Encounter for supervision of normal pregnancy, unspecified, third trimester: Secondary | ICD-10-CM

## 2014-11-30 DIAGNOSIS — Z23 Encounter for immunization: Secondary | ICD-10-CM | POA: Diagnosis not present

## 2014-11-30 DIAGNOSIS — O360120 Maternal care for anti-D [Rh] antibodies, second trimester, not applicable or unspecified: Secondary | ICD-10-CM

## 2014-11-30 DIAGNOSIS — Z3492 Encounter for supervision of normal pregnancy, unspecified, second trimester: Secondary | ICD-10-CM

## 2014-11-30 LAB — POCT URINALYSIS DIPSTICK
BILIRUBIN UA: NEGATIVE
Blood, UA: NEGATIVE
Glucose, UA: NEGATIVE
KETONES UA: NEGATIVE
LEUKOCYTES UA: NEGATIVE
Nitrite, UA: NEGATIVE
PH UA: 7
PROTEIN UA: NEGATIVE
Urobilinogen, UA: NEGATIVE

## 2014-11-30 MED ORDER — RHO D IMMUNE GLOBULIN 1500 UNITS IM SOSY
1500.0000 [IU] | PREFILLED_SYRINGE | Freq: Once | INTRAMUSCULAR | Status: AC
  Administered 2014-11-30: 1500 [IU] via INTRAMUSCULAR

## 2014-11-30 NOTE — Progress Notes (Signed)
ROB: C/o nasal congestion.  Takes Zyrtec, using nasal saline spray.  Advised on benadryl and Mucinex.  For Tdap and rhogam today.  Signed blood consent.  Discussed cord blood banking, info given.   Performing 1 hr glucola today. RTC in 2 weeks.

## 2014-12-01 LAB — HEMOGLOBIN AND HEMATOCRIT, BLOOD
Hematocrit: 39.5 % (ref 34.0–46.6)
Hemoglobin: 13.7 g/dL (ref 11.1–15.9)

## 2014-12-01 LAB — GLUCOSE, 1 HOUR GESTATIONAL: GESTATIONAL DIABETES SCREEN: 83 mg/dL (ref 65–139)

## 2014-12-07 ENCOUNTER — Telehealth: Payer: Self-pay

## 2014-12-07 NOTE — Telephone Encounter (Signed)
Informed pt of normal glucola results.

## 2014-12-07 NOTE — Telephone Encounter (Signed)
-----   Message from Hildred LaserAnika Cherry, MD sent at 12/05/2014  1:37 PM EST ----- Patient with normal glucola screen.  Can inform.  Also needs to sign up for mychart.

## 2014-12-15 ENCOUNTER — Ambulatory Visit (INDEPENDENT_AMBULATORY_CARE_PROVIDER_SITE_OTHER): Payer: BC Managed Care – PPO | Admitting: Obstetrics and Gynecology

## 2014-12-15 VITALS — BP 120/73 | HR 74 | Wt 166.6 lb

## 2014-12-15 DIAGNOSIS — IMO0001 Reserved for inherently not codable concepts without codable children: Secondary | ICD-10-CM

## 2014-12-15 DIAGNOSIS — Z3483 Encounter for supervision of other normal pregnancy, third trimester: Secondary | ICD-10-CM

## 2014-12-15 DIAGNOSIS — O09513 Supervision of elderly primigravida, third trimester: Secondary | ICD-10-CM

## 2014-12-15 DIAGNOSIS — O360130 Maternal care for anti-D [Rh] antibodies, third trimester, not applicable or unspecified: Secondary | ICD-10-CM

## 2014-12-15 DIAGNOSIS — R030 Elevated blood-pressure reading, without diagnosis of hypertension: Secondary | ICD-10-CM

## 2014-12-15 DIAGNOSIS — Z3493 Encounter for supervision of normal pregnancy, unspecified, third trimester: Secondary | ICD-10-CM

## 2014-12-15 LAB — POCT URINALYSIS DIPSTICK
BILIRUBIN UA: NEGATIVE
GLUCOSE UA: NEGATIVE
KETONES UA: NEGATIVE
Leukocytes, UA: NEGATIVE
Nitrite, UA: NEGATIVE
PH UA: 7
Protein, UA: NEGATIVE
RBC UA: NEGATIVE
Urobilinogen, UA: NEGATIVE

## 2014-12-15 NOTE — Progress Notes (Signed)
ROB: Patient c/o dizziness, nausea x 3 days, also with mild RUQ pain.  Denies headache, blurred vision.  BP elevated today with repeat normal.  Will order PIH labs.  Given pre-eclampsia precautions.

## 2014-12-16 ENCOUNTER — Telehealth: Payer: Self-pay

## 2014-12-16 LAB — CBC
HEMATOCRIT: 38 % (ref 34.0–46.6)
HEMOGLOBIN: 13.9 g/dL (ref 11.1–15.9)
MCH: 34.5 pg — AB (ref 26.6–33.0)
MCHC: 36.6 g/dL — AB (ref 31.5–35.7)
MCV: 94 fL (ref 79–97)
Platelets: 209 10*3/uL (ref 150–379)
RBC: 4.03 x10E6/uL (ref 3.77–5.28)
RDW: 12.3 % (ref 12.3–15.4)
WBC: 11.1 10*3/uL — ABNORMAL HIGH (ref 3.4–10.8)

## 2014-12-16 LAB — COMPREHENSIVE METABOLIC PANEL
ALBUMIN: 3.7 g/dL (ref 3.5–5.5)
ALT: 16 IU/L (ref 0–32)
AST: 21 IU/L (ref 0–40)
Albumin/Globulin Ratio: 1.2 (ref 1.1–2.5)
Alkaline Phosphatase: 130 IU/L — ABNORMAL HIGH (ref 39–117)
BUN / CREAT RATIO: 16 (ref 8–20)
BUN: 8 mg/dL (ref 6–20)
Bilirubin Total: <0.2 mg/dL (ref 0.0–1.2)
CO2: 22 mmol/L (ref 18–29)
CREATININE: 0.49 mg/dL — AB (ref 0.57–1.00)
Calcium: 9.5 mg/dL (ref 8.7–10.2)
Chloride: 100 mmol/L (ref 97–106)
GFR, EST AFRICAN AMERICAN: 144 mL/min/{1.73_m2} (ref >59)
GFR, EST NON AFRICAN AMERICAN: 125 mL/min/{1.73_m2} (ref >59)
GLUCOSE: 84 mg/dL (ref 65–99)
Globulin, Total: 3 g/dL (ref 1.5–4.5)
Potassium: 3.9 mmol/L (ref 3.5–5.2)
Sodium: 137 mmol/L (ref 136–144)
TOTAL PROTEIN: 6.7 g/dL (ref 6.0–8.5)

## 2014-12-16 LAB — PROTEIN / CREATININE RATIO, URINE
Creatinine, Urine: 11.8 mg/dL
Protein, Ur: <4 mg/dL
Protein/Creat Ratio: <339 mg/g creat — ABNORMAL HIGH (ref 0–200)

## 2014-12-16 LAB — URIC ACID: Uric Acid: 3.2 mg/dL (ref 2.5–7.1)

## 2014-12-16 NOTE — Telephone Encounter (Signed)
-----   Message from Hildred LaserAnika Cherry, MD sent at 12/16/2014 11:23 AM EST ----- Please call to inform [facility] but did have small amount of protein in urine.  Will be monitoring during the pregnancy.

## 2014-12-16 NOTE — Telephone Encounter (Signed)
LM for pt informing her that labs were negative, and we will continue to monitor.

## 2015-01-03 ENCOUNTER — Ambulatory Visit (INDEPENDENT_AMBULATORY_CARE_PROVIDER_SITE_OTHER): Payer: BC Managed Care – PPO | Admitting: Obstetrics and Gynecology

## 2015-01-03 VITALS — BP 118/73 | HR 67 | Wt 172.2 lb

## 2015-01-03 DIAGNOSIS — O360130 Maternal care for anti-D [Rh] antibodies, third trimester, not applicable or unspecified: Secondary | ICD-10-CM

## 2015-01-03 DIAGNOSIS — Z3493 Encounter for supervision of normal pregnancy, unspecified, third trimester: Secondary | ICD-10-CM

## 2015-01-03 DIAGNOSIS — Z87728 Personal history of other specified (corrected) congenital malformations of nervous system and sense organs: Secondary | ICD-10-CM

## 2015-01-03 DIAGNOSIS — Z3483 Encounter for supervision of other normal pregnancy, third trimester: Secondary | ICD-10-CM

## 2015-01-03 DIAGNOSIS — Z8776 Personal history of (corrected) congenital malformations of integument, limbs and musculoskeletal system: Secondary | ICD-10-CM

## 2015-01-03 DIAGNOSIS — O09513 Supervision of elderly primigravida, third trimester: Secondary | ICD-10-CM

## 2015-01-03 LAB — POCT URINALYSIS DIPSTICK
Bilirubin, UA: NEGATIVE
Glucose, UA: NEGATIVE
KETONES UA: NEGATIVE
Leukocytes, UA: NEGATIVE
Nitrite, UA: NEGATIVE
PROTEIN UA: NEGATIVE
RBC UA: NEGATIVE
SPEC GRAV UA: 1.01
Urobilinogen, UA: NEGATIVE
pH, UA: 6.5

## 2015-01-03 NOTE — Progress Notes (Signed)
ROB: Patient doing well.  Complains of congestion.  Discussed Netti pot, saline nasal spray, Sudafed.  Also advised on no further weight gain in pregnancy [facility]  Discussed healthy food choices, monitoring portions. RTC in 2 weeks.

## 2015-01-08 NOTE — L&D Delivery Note (Signed)
Delivery Summary for Sydney Lynn  Labor Events:   Preterm labor:   Rupture date:   Rupture time:   Rupture type: Intact  Fluid Color:   Induction:   Augmentation:   Complications:   Cervical ripening:          Delivery:   Episiotomy:   Lacerations:   Repair suture:   Repair # of packets:   Blood loss (ml):  300   Information for the patient's newborn:  [name], [name] [[id number]]    Delivery 01/27/2015 12:40 PM by  C-Section, Low Transverse Sex:  female Gestational Age: [redacted]w[redacted]d Delivery Clinician:  Hildred Laser Living?: Yes        APGARS  One minute Five minutes Ten minutes  Skin color: 0   1   2    Heart rate: Grimace: Muscle tone: Breathing: 0   1   2    Totals: Presentation/position: Vertex     Resuscitation: See NICU Consult (infant's chart)  Cord information: 3 vessels   Disposition of cord blood: Yes    Blood gases sent? Yes Complications:   Placenta: Delivered:       appearance Newborn Measurements: Weight: 4 lb 12.9 oz (2180 g)  Height: 19.49"  Head circumference: 27 cm  Chest circumference: 27 cm  Other providers: Delivery Nurse Delivery Nurse  Jacalyn Lefevre Amy Shelby Mattocks  Additional  information: Forceps:   Vacuum:   Breech:   Observed anomalies        See Cesarean Section Delivery Note by Dr. Hildred Laser   Hildred Laser, MD [facility]

## 2015-01-16 ENCOUNTER — Encounter: Payer: BC Managed Care – PPO | Admitting: Certified Nurse Midwife

## 2015-01-17 ENCOUNTER — Encounter: Payer: Self-pay | Admitting: Obstetrics and Gynecology

## 2015-01-17 ENCOUNTER — Ambulatory Visit (INDEPENDENT_AMBULATORY_CARE_PROVIDER_SITE_OTHER): Payer: BC Managed Care – PPO | Admitting: Obstetrics and Gynecology

## 2015-01-17 VITALS — BP 136/90 | HR 70 | Wt 162.3 lb

## 2015-01-17 DIAGNOSIS — IMO0001 Reserved for inherently not codable concepts without codable children: Secondary | ICD-10-CM

## 2015-01-17 DIAGNOSIS — Z3493 Encounter for supervision of normal pregnancy, unspecified, third trimester: Secondary | ICD-10-CM

## 2015-01-17 DIAGNOSIS — R030 Elevated blood-pressure reading, without diagnosis of hypertension: Secondary | ICD-10-CM

## 2015-01-17 DIAGNOSIS — O09512 Supervision of elderly primigravida, second trimester: Secondary | ICD-10-CM

## 2015-01-17 LAB — POCT URINALYSIS DIPSTICK
Bilirubin, UA: NEGATIVE
Blood, UA: NEGATIVE
GLUCOSE UA: NEGATIVE
KETONES UA: NEGATIVE
Leukocytes, UA: NEGATIVE
Nitrite, UA: NEGATIVE
Protein, UA: NEGATIVE
SPEC GRAV UA: 1.01
Urobilinogen, UA: 0.2
pH, UA: 7

## 2015-01-17 NOTE — Progress Notes (Signed)
Rob- no complaints. Patient appears to have gestational hypertension; no signs or symptoms of preeclampsia at this time.  Measuring size less than dates; will schedule ultrasound for growth and AFI.  Return in 1 week for cervical cultures and review of ultrasound.  Fetal kick counts twice a day are encouraged.

## 2015-01-18 ENCOUNTER — Ambulatory Visit (INDEPENDENT_AMBULATORY_CARE_PROVIDER_SITE_OTHER): Payer: BC Managed Care – PPO

## 2015-01-18 DIAGNOSIS — R030 Elevated blood-pressure reading, without diagnosis of hypertension: Secondary | ICD-10-CM | POA: Diagnosis not present

## 2015-01-18 DIAGNOSIS — IMO0001 Reserved for inherently not codable concepts without codable children: Secondary | ICD-10-CM

## 2015-01-18 DIAGNOSIS — O09512 Supervision of elderly primigravida, second trimester: Secondary | ICD-10-CM | POA: Diagnosis not present

## 2015-01-25 ENCOUNTER — Ambulatory Visit (INDEPENDENT_AMBULATORY_CARE_PROVIDER_SITE_OTHER): Payer: BC Managed Care – PPO | Admitting: Obstetrics and Gynecology

## 2015-01-25 VITALS — BP 145/97 | HR 80 | Wt 173.5 lb

## 2015-01-25 DIAGNOSIS — Z3483 Encounter for supervision of other normal pregnancy, third trimester: Secondary | ICD-10-CM

## 2015-01-25 DIAGNOSIS — Z369 Encounter for antenatal screening, unspecified: Secondary | ICD-10-CM

## 2015-01-25 DIAGNOSIS — O365930 Maternal care for other known or suspected poor fetal growth, third trimester, not applicable or unspecified: Secondary | ICD-10-CM

## 2015-01-25 DIAGNOSIS — Z113 Encounter for screening for infections with a predominantly sexual mode of transmission: Secondary | ICD-10-CM

## 2015-01-25 DIAGNOSIS — B009 Herpesviral infection, unspecified: Secondary | ICD-10-CM | POA: Insufficient documentation

## 2015-01-25 DIAGNOSIS — O26843 Uterine size-date discrepancy, third trimester: Secondary | ICD-10-CM

## 2015-01-25 DIAGNOSIS — O1403 Mild to moderate pre-eclampsia, third trimester: Secondary | ICD-10-CM

## 2015-01-25 DIAGNOSIS — Z3493 Encounter for supervision of normal pregnancy, unspecified, third trimester: Secondary | ICD-10-CM

## 2015-01-25 DIAGNOSIS — Z36 Encounter for antenatal screening of mother: Secondary | ICD-10-CM

## 2015-01-25 LAB — POCT URINALYSIS DIPSTICK
BILIRUBIN UA: NEGATIVE
Glucose, UA: NEGATIVE
KETONES UA: NEGATIVE
Leukocytes, UA: NEGATIVE
Nitrite, UA: NEGATIVE
PH UA: 6.5
Protein, UA: NEGATIVE
RBC UA: NEGATIVE
Urobilinogen, UA: NEGATIVE

## 2015-01-25 MED ORDER — VALACYCLOVIR HCL 500 MG PO TABS: 500.0000 mg | ORAL_TABLET | Freq: Two times a day (BID) | ORAL | Status: DC

## 2015-01-25 NOTE — Progress Notes (Signed)
ROB: Patient with no complaints.  Elevated BP again today, negative proteinuria, (however on review of labs, patient was noted to have urine P/c ratio of 339 on 12/15/14).  Patient with likely mild pre-eclampsia.  Review of ultrasound last week notes growth at 10th percentile, AFI at 5th percentile (8.9 cm) .  Also still with size less than dates. Will repeat growth scan with Dopplers and order NST, and likely plan for IOL at 37 weeks in light of IUGR and mild pre-eclampsia. Also will repeat PIH labs. Needs to begin HSV prophylaxis. 36 week cultures done today.

## 2015-01-25 NOTE — Addendum Note (Signed)
Addended by: Fabian November on: 01/25/2015 04:52 PM   Modules accepted: Orders

## 2015-01-26 ENCOUNTER — Inpatient Hospital Stay
Admission: EM | Admit: 2015-01-26 | Discharge: 2015-01-29 | DRG: 765 | Disposition: A | Discharge status: Home / Self Care | Payer: BC Managed Care – PPO | Attending: Obstetrics and Gynecology | Admitting: Obstetrics and Gynecology

## 2015-01-26 ENCOUNTER — Ambulatory Visit (INDEPENDENT_AMBULATORY_CARE_PROVIDER_SITE_OTHER): Payer: BC Managed Care – PPO | Admitting: Obstetrics and Gynecology

## 2015-01-26 ENCOUNTER — Ambulatory Visit (INDEPENDENT_AMBULATORY_CARE_PROVIDER_SITE_OTHER): Payer: BC Managed Care – PPO

## 2015-01-26 ENCOUNTER — Other Ambulatory Visit: Payer: Self-pay | Admitting: Obstetrics and Gynecology

## 2015-01-26 VITALS — BP 151/88 | HR 65

## 2015-01-26 DIAGNOSIS — Z880 Allergy status to penicillin: Secondary | ICD-10-CM | POA: Diagnosis not present

## 2015-01-26 DIAGNOSIS — Q059 Spina bifida, unspecified: Secondary | ICD-10-CM

## 2015-01-26 DIAGNOSIS — J45990 Exercise induced bronchospasm: Secondary | ICD-10-CM | POA: Diagnosis present

## 2015-01-26 DIAGNOSIS — O1403 Mild to moderate pre-eclampsia, third trimester: Secondary | ICD-10-CM

## 2015-01-26 DIAGNOSIS — O9952 Diseases of the respiratory system complicating childbirth: Secondary | ICD-10-CM | POA: Diagnosis present

## 2015-01-26 DIAGNOSIS — O26843 Uterine size-date discrepancy, third trimester: Secondary | ICD-10-CM

## 2015-01-26 DIAGNOSIS — Z3A37 37 weeks gestation of pregnancy: Secondary | ICD-10-CM | POA: Diagnosis not present

## 2015-01-26 DIAGNOSIS — Z91030 Bee allergy status: Secondary | ICD-10-CM | POA: Diagnosis not present

## 2015-01-26 DIAGNOSIS — O365930 Maternal care for other known or suspected poor fetal growth, third trimester, not applicable or unspecified: Principal | ICD-10-CM | POA: Diagnosis present

## 2015-01-26 DIAGNOSIS — O1404 Mild to moderate pre-eclampsia, complicating childbirth: Secondary | ICD-10-CM | POA: Diagnosis present

## 2015-01-26 DIAGNOSIS — J45909 Unspecified asthma, uncomplicated: Secondary | ICD-10-CM | POA: Diagnosis present

## 2015-01-26 DIAGNOSIS — O09292 Supervision of pregnancy with other poor reproductive or obstetric history, second trimester: Secondary | ICD-10-CM | POA: Insufficient documentation

## 2015-01-26 DIAGNOSIS — O09513 Supervision of elderly primigravida, third trimester: Secondary | ICD-10-CM

## 2015-01-26 DIAGNOSIS — IMO0002 Reserved for concepts with insufficient information to code with codable children: Secondary | ICD-10-CM

## 2015-01-26 DIAGNOSIS — O9852 Other viral diseases complicating childbirth: Secondary | ICD-10-CM | POA: Diagnosis present

## 2015-01-26 DIAGNOSIS — O365990 Maternal care for other known or suspected poor fetal growth, unspecified trimester, not applicable or unspecified: Secondary | ICD-10-CM | POA: Diagnosis present

## 2015-01-26 DIAGNOSIS — Z7951 Long term (current) use of inhaled steroids: Secondary | ICD-10-CM | POA: Diagnosis not present

## 2015-01-26 DIAGNOSIS — B009 Herpesviral infection, unspecified: Secondary | ICD-10-CM | POA: Diagnosis present

## 2015-01-26 DIAGNOSIS — O4103X0 Oligohydramnios, third trimester, not applicable or unspecified: Secondary | ICD-10-CM | POA: Diagnosis present

## 2015-01-26 LAB — COMPREHENSIVE METABOLIC PANEL
A/G RATIO: 1.4 (ref 1.1–2.5)
ALBUMIN: 3 g/dL — AB (ref 3.5–5.0)
ALT: 35 IU/L — AB (ref 0–32)
ALT: 38 U/L (ref 14–54)
ANION GAP: 9 (ref 5–15)
AST: 39 IU/L (ref 0–40)
AST: 45 U/L — ABNORMAL HIGH (ref 15–41)
Albumin: 3.8 g/dL (ref 3.5–5.5)
Alkaline Phosphatase: 146 IU/L — ABNORMAL HIGH (ref 39–117)
Alkaline Phosphatase: 138 U/L — ABNORMAL HIGH (ref 38–126)
BUN/Creatinine Ratio: 24 — ABNORMAL HIGH (ref 8–20)
BUN: 12 mg/dL (ref 6–20)
BUN: 16 mg/dL (ref 6–20)
CALCIUM: 8.9 mg/dL (ref 8.9–10.3)
CHLORIDE: 101 mmol/L (ref 96–106)
CHLORIDE: 106 mmol/L (ref 101–111)
CO2: 20 mmol/L (ref 18–29)
CO2: 20 mmol/L — AB (ref 22–32)
Calcium: 8.9 mg/dL (ref 8.7–10.2)
Creatinine, Ser: 0.51 mg/dL — ABNORMAL LOW (ref 0.57–1.00)
Creatinine, Ser: 0.59 mg/dL (ref 0.44–1.00)
GFR calc non Af Amer: 123 mL/min/{1.73_m2} (ref >59)
GFR calc non Af Amer: >60 mL/min (ref >60)
GFR, EST AFRICAN AMERICAN: 142 mL/min/{1.73_m2} (ref >59)
GLOBULIN, TOTAL: 2.7 g/dL (ref 1.5–4.5)
GLUCOSE: 80 mg/dL (ref 65–99)
Glucose: 74 mg/dL (ref 65–99)
POTASSIUM: 4.3 mmol/L (ref 3.5–5.2)
POTASSIUM: 3.6 mmol/L (ref 3.5–5.1)
SODIUM: 138 mmol/L (ref 134–144)
SODIUM: 135 mmol/L (ref 135–145)
Total Bilirubin: 0.6 mg/dL (ref 0.3–1.2)
Total Protein: 6.5 g/dL (ref 6.0–8.5)
Total Protein: 7 g/dL (ref 6.5–8.1)

## 2015-01-26 LAB — CBC
HCT: 38.6 % (ref 35.0–47.0)
HEMOGLOBIN: 13.3 g/dL (ref 12.0–16.0)
Hematocrit: 38.6 % (ref 34.0–46.6)
Hemoglobin: 13.8 g/dL (ref 11.1–15.9)
MCH: 34.3 pg — ABNORMAL HIGH (ref 26.6–33.0)
MCH: 33.9 pg (ref 26.0–34.0)
MCHC: 35.8 g/dL — ABNORMAL HIGH (ref 31.5–35.7)
MCHC: 34.6 g/dL (ref 32.0–36.0)
MCV: 96 fL (ref 79–97)
MCV: 98 fL (ref 80.0–100.0)
PLATELETS: 191 10*3/uL (ref 150–379)
PLATELETS: 197 10*3/uL (ref 150–440)
RBC: 4.02 x10E6/uL (ref 3.77–5.28)
RBC: 3.94 MIL/uL (ref 3.80–5.20)
RDW: 13.5 % (ref 12.3–15.4)
RDW: 13.1 % (ref 11.5–14.5)
WBC: 10.8 10*3/uL (ref 3.4–10.8)
WBC: 12.5 10*3/uL — ABNORMAL HIGH (ref 3.6–11.0)

## 2015-01-26 LAB — PROTEIN / CREATININE RATIO, URINE
Creatinine, Urine: 33.7 mg/dL
PROTEIN/CREAT RATIO: 157 mg/g{creat} (ref 0–200)
Protein, Ur: 5.3 mg/dL

## 2015-01-26 LAB — GC/CHLAMYDIA PROBE AMP
CHLAMYDIA, DNA PROBE: NEGATIVE
NEISSERIA GONORRHOEAE BY PCR: NEGATIVE

## 2015-01-26 LAB — URIC ACID: URIC ACID: 4.7 mg/dL (ref 2.5–7.1)

## 2015-01-26 MED ORDER — HYDRALAZINE HCL 20 MG/ML IJ SOLN: 10.0000 mg | Freq: Once | INTRAMUSCULAR | Status: DC | PRN

## 2015-01-26 MED ORDER — TERBUTALINE SULFATE 1 MG/ML IJ SOLN: 0.2500 mg | Freq: Once | INTRAMUSCULAR | Status: DC | PRN

## 2015-01-26 MED ORDER — CITRIC ACID-SODIUM CITRATE 334-500 MG/5ML PO SOLN: 30.0000 mL | ORAL | Status: DC | PRN

## 2015-01-26 MED ORDER — MISOPROSTOL 200 MCG PO TABS
ORAL_TABLET | ORAL | Status: AC
  Filled 2015-01-26: qty 4

## 2015-01-26 MED ORDER — ONDANSETRON HCL 4 MG/2ML IJ SOLN
4.0000 mg | Freq: Four times a day (QID) | INTRAMUSCULAR | Status: DC | PRN
  Administered 2015-01-27: 4 mg via INTRAVENOUS

## 2015-01-26 MED ORDER — LIDOCAINE HCL (PF) 1 % IJ SOLN
INTRAMUSCULAR | Status: AC
  Filled 2015-01-26: qty 30

## 2015-01-26 MED ORDER — OXYTOCIN 10 UNIT/ML IJ SOLN
1.0000 m[IU]/min | INTRAVENOUS | Status: DC
  Filled 2015-01-26: qty 4

## 2015-01-26 MED ORDER — LIDOCAINE HCL (PF) 1 % IJ SOLN: 30.0000 mL | INTRAMUSCULAR | Status: DC | PRN

## 2015-01-26 MED ORDER — MISOPROSTOL 25 MCG QUARTER TABLET
25.0000 ug | ORAL_TABLET | ORAL | Status: DC | PRN
  Filled 2015-01-26: qty 0.25

## 2015-01-26 MED ORDER — OXYTOCIN 40 UNITS IN LACTATED RINGERS INFUSION - SIMPLE MED
INTRAVENOUS | Status: AC
  Filled 2015-01-26: qty 1000

## 2015-01-26 MED ORDER — BUTORPHANOL TARTRATE 1 MG/ML IJ SOLN: 1.0000 mg | INTRAMUSCULAR | Status: DC | PRN

## 2015-01-26 MED ORDER — ACETAMINOPHEN 325 MG PO TABS: 650.0000 mg | ORAL_TABLET | ORAL | Status: DC | PRN

## 2015-01-26 MED ORDER — OXYCODONE-ACETAMINOPHEN 5-325 MG PO TABS: 1.0000 | ORAL_TABLET | ORAL | Status: DC | PRN

## 2015-01-26 MED ORDER — LACTATED RINGERS IV SOLN
500.0000 mL | INTRAVENOUS | Status: DC | PRN
  Administered 2015-01-27 (×2): via INTRAVENOUS

## 2015-01-26 MED ORDER — OXYTOCIN 10 UNIT/ML IJ SOLN
2.5000 [IU]/h | INTRAVENOUS | Status: DC
  Filled 2015-01-26: qty 4

## 2015-01-26 MED ORDER — OXYTOCIN BOLUS FROM INFUSION: 500.0000 mL | INTRAVENOUS | Status: DC

## 2015-01-26 MED ORDER — LABETALOL HCL 5 MG/ML IV SOLN
20.0000 mg | INTRAVENOUS | Status: DC | PRN
Start: 2015-01-26 — End: 2015-01-27

## 2015-01-26 MED ORDER — LACTATED RINGERS IV SOLN
INTRAVENOUS | Status: DC
  Administered 2015-01-26 – 2015-01-27 (×2): via INTRAVENOUS
  Administered 2015-01-27: 1000 mL via INTRAVENOUS

## 2015-01-26 MED ORDER — AMMONIA AROMATIC IN INHA
RESPIRATORY_TRACT | Status: AC
  Filled 2015-01-26: qty 10

## 2015-01-26 MED ORDER — OXYTOCIN 10 UNIT/ML IJ SOLN
INTRAMUSCULAR | Status: AC
  Filled 2015-01-26: qty 2

## 2015-01-26 MED ORDER — OXYCODONE-ACETAMINOPHEN 5-325 MG PO TABS: 2.0000 | ORAL_TABLET | ORAL | Status: DC | PRN

## 2015-01-26 MED ORDER — DINOPROSTONE 10 MG VA INST
10.0000 mg | VAGINAL_INSERT | Freq: Once | VAGINAL | Status: AC
  Administered 2015-01-26: 10 mg via VAGINAL
  Filled 2015-01-26: qty 1

## 2015-01-26 NOTE — Progress Notes (Signed)
Consult OB: Patient presents for f/u after ultrasound and for labs to f/u mild pre-eclampsia. Ultrasound results noted below (IUGR with growth <10%, normal dopplers, oligohydramnios with AFI < 2%).  Discussion had regarding findings.  Recommend delivery at 37 weeks.  Scheduled for IOL this evening.   Follow up OB Ultrasound:  Indications:Growth, AFI and UA dopplers Findings:  Singleton intrauterine pregnancy is visualized with FHR at 152 BPM. Biometrics give an (U/S) Gestational age of 69 0/7 weeks and an (U/S) EDD of 03/02/15; this correlates with the clinically established EDD of 02/17/15.  Fetal presentation is Vertex.  EFW: 2363g (5lb 3oz). Placenta: posterior, grade 3. AFI: Oligohydramnios at 6.8 cm. (2.5th percentile for 37 weeks)  The Abdominal Circumferance has a greater than 2 week discrepancy measuring less than the HC, BPD, HL and FL. Umbilical Artery Dopplers were performed due to this discrepancy. UA S/D ratio is 2.95 which falls in between the 50th and 95th percentile for 37 weeks.  Impression: 1. 35 0/7 week Viable Singleton Intrauterine pregnancy by U/S. 2. (U/S) EDD is consistent with Clinically established EDD. 3. Oligohydramnios at 6.8 cm. (2.5th percentile for 37 weeks) 4. UA S/D ratio is 2.95 which falls in between the 50th and 95th percentile for 37 weeks.   Recommendations: 1.Clinical correlation with the patient's History and Physical Exam.   A total of 15 minutes were spent face-to-face with the patient during this encounter and over half of that time dealt with counseling and coordination of care.

## 2015-01-27 ENCOUNTER — Inpatient Hospital Stay: Payer: BC Managed Care – PPO | Admitting: Certified Registered"

## 2015-01-27 ENCOUNTER — Encounter
Admission: EM | Disposition: A | Discharge status: Home / Self Care | Payer: Self-pay | Source: Home / Self Care | Attending: Obstetrics and Gynecology

## 2015-01-27 DIAGNOSIS — O365931 Maternal care for other known or suspected poor fetal growth, third trimester, fetus 1: Secondary | ICD-10-CM

## 2015-01-27 DIAGNOSIS — O1403 Mild to moderate pre-eclampsia, third trimester: Secondary | ICD-10-CM

## 2015-01-27 LAB — ABO/RH: ABO/RH(D): A NEG

## 2015-01-27 LAB — TYPE AND SCREEN
ABO/RH(D): A NEG
ANTIBODY SCREEN: POSITIVE

## 2015-01-27 SURGERY — Surgical Case
Anesthesia: Spinal | Wound class: Clean Contaminated

## 2015-01-27 MED ORDER — CITRIC ACID-SODIUM CITRATE 334-500 MG/5ML PO SOLN
30.0000 mL | ORAL | Status: AC
  Administered 2015-01-27: 30 mL via ORAL
  Filled 2015-01-27: qty 30

## 2015-01-27 MED ORDER — WITCH HAZEL-GLYCERIN EX PADS: 1.0000 "application " | MEDICATED_PAD | CUTANEOUS | Status: DC | PRN

## 2015-01-27 MED ORDER — BUPIVACAINE IN DEXTROSE 0.75-8.25 % IT SOLN
INTRATHECAL | Status: DC | PRN
  Administered 2015-01-27: 1.6 mL via INTRATHECAL

## 2015-01-27 MED ORDER — LIDOCAINE 5 % EX PTCH
MEDICATED_PATCH | CUTANEOUS | Status: AC
  Filled 2015-01-27: qty 1

## 2015-01-27 MED ORDER — SENNOSIDES-DOCUSATE SODIUM 8.6-50 MG PO TABS
2.0000 | ORAL_TABLET | ORAL | Status: DC
  Administered 2015-01-28 – 2015-01-29 (×2): 2 via ORAL
  Filled 2015-01-27 (×2): qty 2

## 2015-01-27 MED ORDER — ZOLPIDEM TARTRATE 5 MG PO TABS: 5.0000 mg | ORAL_TABLET | Freq: Every evening | ORAL | Status: DC | PRN

## 2015-01-27 MED ORDER — LACTATED RINGERS IV SOLN
INTRAVENOUS | Status: DC
  Administered 2015-01-27: 23:00:00 via INTRAVENOUS

## 2015-01-27 MED ORDER — SIMETHICONE 80 MG PO CHEW: 80.0000 mg | CHEWABLE_TABLET | ORAL | Status: DC | PRN

## 2015-01-27 MED ORDER — DIPHENHYDRAMINE HCL 50 MG/ML IJ SOLN
12.5000 mg | Freq: Once | INTRAMUSCULAR | Status: AC
  Administered 2015-01-27: 12.5 mg via INTRAVENOUS

## 2015-01-27 MED ORDER — IBUPROFEN 800 MG PO TABS
800.0000 mg | ORAL_TABLET | Freq: Four times a day (QID) | ORAL | Status: DC
  Administered 2015-01-27 – 2015-01-29 (×7): 800 mg via ORAL
  Filled 2015-01-27 (×7): qty 1

## 2015-01-27 MED ORDER — DIBUCAINE 1 % RE OINT: 1.0000 "application " | TOPICAL_OINTMENT | RECTAL | Status: DC | PRN

## 2015-01-27 MED ORDER — LIDOCAINE 5 % EX PTCH
1.0000 | MEDICATED_PATCH | CUTANEOUS | Status: DC
Start: 2015-01-27 — End: 2015-01-29
  Administered 2015-01-29: 1 via TRANSDERMAL
  Filled 2015-01-27: qty 1

## 2015-01-27 MED ORDER — CEFAZOLIN SODIUM-DEXTROSE 2-3 GM-% IV SOLR
2.0000 g | INTRAVENOUS | Status: AC
  Administered 2015-01-27: 2 g via INTRAVENOUS
  Filled 2015-01-27: qty 50

## 2015-01-27 MED ORDER — CEFAZOLIN SODIUM-DEXTROSE 2-3 GM-% IV SOLR
INTRAVENOUS | Status: DC | PRN
  Administered 2015-01-27: 2 g via INTRAVENOUS

## 2015-01-27 MED ORDER — OXYTOCIN 40 UNITS IN LACTATED RINGERS INFUSION - SIMPLE MED
INTRAVENOUS | Status: DC | PRN
  Administered 2015-01-27: 400 mL via INTRAVENOUS

## 2015-01-27 MED ORDER — SIMETHICONE 80 MG PO CHEW: 80.0000 mg | CHEWABLE_TABLET | ORAL | Status: DC

## 2015-01-27 MED ORDER — MORPHINE SULFATE (PF) 0.5 MG/ML IJ SOLN
INTRAMUSCULAR | Status: DC | PRN
  Administered 2015-01-27: .2 mg via EPIDURAL

## 2015-01-27 MED ORDER — OXYCODONE-ACETAMINOPHEN 5-325 MG PO TABS
1.0000 | ORAL_TABLET | ORAL | Status: DC | PRN
Start: 2015-01-27 — End: 2015-01-29

## 2015-01-27 MED ORDER — OXYTOCIN 40 UNITS IN LACTATED RINGERS INFUSION - SIMPLE MED
2.5000 [IU]/h | INTRAVENOUS | Status: DC
  Filled 2015-01-27: qty 1000

## 2015-01-27 MED ORDER — SODIUM CHLORIDE 0.9 % IJ SOLN
INTRAMUSCULAR | Status: AC
  Filled 2015-01-27: qty 3

## 2015-01-27 MED ORDER — ACETAMINOPHEN 325 MG PO TABS: 650.0000 mg | ORAL_TABLET | ORAL | Status: DC | PRN

## 2015-01-27 MED ORDER — FENTANYL CITRATE (PF) 100 MCG/2ML IJ SOLN: 25.0000 ug | INTRAMUSCULAR | Status: DC | PRN

## 2015-01-27 MED ORDER — GLYCOPYRROLATE 0.2 MG/ML IJ SOLN
INTRAMUSCULAR | Status: DC | PRN
  Administered 2015-01-27 (×2): 0.1 mg via INTRAVENOUS

## 2015-01-27 MED ORDER — MENTHOL 3 MG MT LOZG
1.0000 | LOZENGE | OROMUCOSAL | Status: DC | PRN
  Filled 2015-01-27: qty 9

## 2015-01-27 MED ORDER — PRENATAL MULTIVITAMIN CH
1.0000 | ORAL_TABLET | Freq: Every day | ORAL | Status: DC
  Administered 2015-01-28 – 2015-01-29 (×2): 1 via ORAL
  Filled 2015-01-27 (×2): qty 1

## 2015-01-27 MED ORDER — FERROUS SULFATE 325 (65 FE) MG PO TABS
325.0000 mg | ORAL_TABLET | Freq: Two times a day (BID) | ORAL | Status: DC
  Administered 2015-01-27 – 2015-01-29 (×4): 325 mg via ORAL
  Filled 2015-01-27 (×4): qty 1

## 2015-01-27 MED ORDER — LANOLIN HYDROUS EX OINT: 1.0000 "application " | TOPICAL_OINTMENT | CUTANEOUS | Status: DC | PRN

## 2015-01-27 MED ORDER — OXYCODONE-ACETAMINOPHEN 5-325 MG PO TABS: 2.0000 | ORAL_TABLET | ORAL | Status: DC | PRN

## 2015-01-27 MED ORDER — LIDOCAINE 5 % EX PTCH
MEDICATED_PATCH | CUTANEOUS | Status: DC | PRN
  Administered 2015-01-27: 1 via TRANSDERMAL

## 2015-01-27 MED ORDER — DIPHENHYDRAMINE HCL 50 MG/ML IJ SOLN
INTRAMUSCULAR | Status: AC
  Filled 2015-01-27: qty 1

## 2015-01-27 MED ORDER — DIPHENHYDRAMINE HCL 25 MG PO CAPS
25.0000 mg | ORAL_CAPSULE | Freq: Four times a day (QID) | ORAL | Status: DC | PRN
  Administered 2015-01-27 – 2015-01-28 (×4): 25 mg via ORAL
  Filled 2015-01-27 (×4): qty 1

## 2015-01-27 MED ORDER — EPHEDRINE SULFATE 50 MG/ML IJ SOLN
INTRAMUSCULAR | Status: DC | PRN
  Administered 2015-01-27 (×2): 10 mg via INTRAVENOUS

## 2015-01-27 MED ORDER — ONDANSETRON HCL 4 MG/2ML IJ SOLN: 4.0000 mg | Freq: Once | INTRAMUSCULAR | Status: DC | PRN

## 2015-01-27 MED ORDER — MAGNESIUM HYDROXIDE 400 MG/5ML PO SUSP: 30.0000 mL | ORAL | Status: DC | PRN

## 2015-01-27 SURGICAL SUPPLY — 28 items
BAG COUNTER SPONGE EZ (MISCELLANEOUS) ×2 IMPLANT
BAG SPNG 4X4 CLR HAZ (MISCELLANEOUS) ×1
CANISTER SUCT 3000ML (MISCELLANEOUS) ×3 IMPLANT
CHLORAPREP W/TINT 26ML (MISCELLANEOUS) ×6 IMPLANT
COUNTER SPONGE BAG EZ (MISCELLANEOUS) ×1
DRSG TELFA 3X8 NADH (GAUZE/BANDAGES/DRESSINGS) ×3 IMPLANT
ELECT REM PT RETURN 9FT ADLT (ELECTROSURGICAL) ×3
ELECTRODE REM PT RTRN 9FT ADLT (ELECTROSURGICAL) ×1 IMPLANT
GAUZE SPONGE 4X4 12PLY STRL (GAUZE/BANDAGES/DRESSINGS) ×3 IMPLANT
GLOVE BIO SURGEON STRL SZ 6 (GLOVE) ×3 IMPLANT
GLOVE BIOGEL PI IND STRL 6.5 (GLOVE) ×1 IMPLANT
GLOVE BIOGEL PI INDICATOR 6.5 (GLOVE) ×2
GOWN STRL REUS W/ TWL LRG LVL3 (GOWN DISPOSABLE) ×2 IMPLANT
GOWN STRL REUS W/TWL LRG LVL3 (GOWN DISPOSABLE) ×6
KIT RM TURNOVER STRD PROC AR (KITS) ×3 IMPLANT
MARKER SKIN DUAL TIP RULER LAB (MISCELLANEOUS) ×3 IMPLANT
NS IRRIG 1000ML POUR BTL (IV SOLUTION) ×3 IMPLANT
PACK C SECTION AR (MISCELLANEOUS) ×3 IMPLANT
PAD DRESSING TELFA 3X8 NADH (GAUZE/BANDAGES/DRESSINGS) ×1 IMPLANT
PAD OB MATERNITY 4.3X12.25 (PERSONAL CARE ITEMS) ×3 IMPLANT
PAD PREP 24X41 OB/GYN DISP (PERSONAL CARE ITEMS) ×3 IMPLANT
SUT CHROMIC 0 CT 1 (SUTURE) IMPLANT
SUT MNCRL AB 4-0 PS2 18 (SUTURE) ×3 IMPLANT
SUT PLAIN 2 0 XLH (SUTURE) IMPLANT
SUT VIC AB 0 CT1 36 (SUTURE) ×6 IMPLANT
SUT VIC AB 1 CT1 36 (SUTURE) ×6 IMPLANT
SUT VIC AB 3-0 SH 27 (SUTURE) ×3
SUT VIC AB 3-0 SH 27X BRD (SUTURE) ×1 IMPLANT

## 2015-01-27 NOTE — Anesthesia Preprocedure Evaluation (Signed)
Anesthesia Evaluation  Patient identified by MRN, date of birth, ID band Patient awake    Reviewed: Allergy & Precautions, NPO status , Patient's Chart, lab work & pertinent test results  History of Anesthesia Complications Negative for: history of anesthetic complications  Airway Mallampati: II       Dental  (+) Teeth Intact   Pulmonary asthma ,           Cardiovascular hypertension,      Neuro/Psych negative neurological ROS     GI/Hepatic Neg liver ROS, GERD  ,  Endo/Other  negative endocrine ROS  Renal/GU negative Renal ROS     Musculoskeletal   Abdominal   Peds  Hematology negative hematology ROS (+)   Anesthesia Other Findings   Reproductive/Obstetrics                             Anesthesia Physical Anesthesia Plan  ASA: II  Anesthesia Plan: Spinal   Post-op Pain Management:    Induction:   Airway Management Planned:   Additional Equipment:   Intra-op Plan:   Post-operative Plan:   Informed Consent: I have reviewed the patients History and Physical, chart, labs and discussed the procedure including the risks, benefits and alternatives for the proposed anesthesia with the patient or authorized representative who has indicated his/her understanding and acceptance.     Plan Discussed with:   Anesthesia Plan Comments:         Anesthesia Quick Evaluation

## 2015-01-27 NOTE — Anesthesia Procedure Notes (Signed)
Spinal Patient location during procedure: OR Start time: 01/27/2015 12:15 PM End time: 01/27/2015 12:24 PM Preanesthetic Checklist Completed: patient identified, site marked, surgical consent, pre-op evaluation, timeout performed, IV checked, risks and benefits discussed and monitors and equipment checked Spinal Block Patient position: sitting Prep: Betadine Patient monitoring: heart rate, continuous pulse ox, blood pressure and cardiac monitor Approach: midline Location: L4-5 Injection technique: single-shot Needle Needle type: Whitacre and Introducer  Needle gauge: 27 G Needle length: 9 cm Needle insertion depth: 8 cm Additional Notes Negative paresthesia. Negative blood return. Positive free-flowing CSF. Expiration date of kit checked and confirmed. Patient tolerated procedure well, without complications.

## 2015-01-27 NOTE — Transfer of Care (Signed)
Immediate Anesthesia Transfer of Care Note  Patient: Sydney Lynn  Procedure(s) Performed: Procedure(s): CESAREAN SECTION (N/A)  Patient Location: [facility]  Anesthesia Type:Spinal  Level of Consciousness: awake, alert , oriented and patient cooperative  Airway & Oxygen Therapy: Patient Spontanous Breathing  Post-op Assessment: Report given to RN, Post -op Vital signs reviewed and stable and Patient moving all extremities X 4  Post vital signs: Reviewed and stable  Last Vitals:  Filed Vitals:   01/27/15 1028 01/27/15 1341  BP: 145/77 122/69  Pulse: 68 59  Temp:  36.5 C  Resp:  15    Complications: No apparent anesthesia complications

## 2015-01-27 NOTE — H&P (Addendum)
Obstetric History and Physical  Sydney Lynn is a 40 y.o. G1P0 with IUP at [redacted]w[redacted]d presenting for scheduled IOL for oligohydramnios, mild pre-eclampsia, and IUGR. Patient denies contractions, none vaginal bleeding, intact membranes, with active fetal movement.    Prenatal Course Source of Care: [facility] with onset of care at 11 weeks Pregnancy complications or risks: Patient Active Problem List   Diagnosis Date Noted  . IUGR (intrauterine growth restriction) 01/26/2015  . Mild pre-eclampsia in third trimester 01/26/2015  . IUGR (intrauterine growth restriction) affecting care of mother 01/26/2015  . 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 Rh negative state in antepartum period 10/25/2014  . Supervision of normal pregnancy in third trimester 09/28/2014  . Advanced maternal age, 1st pregnancy 09/28/2014  . H/O spina bifida 07/28/2014  . Low back pain associated with a spinal disorder other than radiculopathy or spinal stenosis 12/17/2013  . Lateral pain of left hip 09/26/2011  . PATELLO-FEMORAL SYNDROME 08/17/2008  . UNEQUAL LEG LENGTH 08/17/2008  . ABNORMALITY OF GAIT 08/17/2008   She plans to breastfeed She desires condoms for postpartum contraception.   Prenatal labs and studies: ABO, Rh: --/--/A NEG, A NEG (01/19 2238) Antibody: POS (01/19 2238) Rubella: 1.03 (06/30 1527) RPR: Non Reactive (06/30 1527)  HBsAg: Negative (06/30 1527)  HIV: Non Reactive (06/30 1527)  GBS: pending 1 hr Glucola: normal Genetic screening normal Anatomy US normal  Prenatal Transfer Tool  Maternal Diabetes: No Genetic Screening: Normal Maternal Ultrasounds/Referrals: Abnormal:  Findings:   IUGR Fetal Ultrasounds or other Referrals:  None Maternal Substance Abuse:  No Significant Maternal Medications:  None Significant Maternal Lab Results: Lab values include: Group B Strep negative  Past Medical History  Diagnosis Date  . Asthma     Exercise Induced  . 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 HSV-2 infection   . H/O spina bifida     Type 1    Past Surgical History  Procedure Laterality Date  . Nasal sinus surgery      OB History  Gravida Para Term Preterm AB SAB TAB Ectopic Multiple Living  1             # Outcome Date GA Lbr Len/2nd Weight Sex Delivery Anes PTL Lv  1 Current               Social History   Social History  . Marital Status: Single    Spouse Name: N/A  . Number of Children: N/A  . Years of Education: N/A   Social History Main Topics  . Smoking status: Never Smoker   . Smokeless tobacco: Never Used  . Alcohol Use: No  . Drug Use: No  . Sexual Activity: Yes    Birth Control/ Protection: None     Comment: Pregnant   Other Topics Concern  . None   Social History Narrative    Family History  Problem Relation Age of Onset  . Hypertension Mother   . Spina bifida Father     mild  . Spina bifida Brother     mild    Prescriptions prior to admission  Medication Sig Dispense Refill Last Dose  . albuterol (VENTOLIN HFA) 108 (90 BASE) MCG/ACT inhaler Inhale 2 puffs into the lungs as needed. 1 Inhaler 3 Taking  . cetirizine (ZYRTEC) 5 MG tablet Take 5 mg by mouth daily.   Taking  . EPINEPHrine (EPIPEN 2-PAK) 0.3 mg/0.3 mL IJ SOAJ injection  Inject 0.3 mLs (0.3 mg total) into the muscle once. 1 Device 3 Taking  . folic acid (FOLVITE) 1 MG tablet Take 1 mg by mouth daily.   Taking  . prenatal vitamin w/FE, FA (PRENATAL 1 + 1) 27-1 MG TABS tablet Take 1 tablet by mouth daily at 12 noon.   Taking  . triamcinolone (NASACORT) 55 MCG/ACT nasal inhaler Place 2 sprays into both nostrils daily.    Taking  . valACYclovir (VALTREX) 500 MG tablet Take 1 tablet (500 mg total) by mouth 2 (two) times daily. Can increase to twice a day for 5 days in the event of a recurrence 60 tablet 1 Taking    Allergies  Allergen Reactions  . Amoxicillin Hives  . Bee Venom     Review of Systems: Negative  except for what is mentioned in HPI.  Physical Exam: Filed Vitals:   01/27/15 0420 01/27/15 0520 01/27/15 0620 01/27/15 0720  BP: 110/57 121/70 123/79 134/66  Pulse: 53 59 72 62  Temp:      TempSrc:      Resp:      Height:      Weight:       CONSTITUTIONAL: Well-developed, well-nourished female in no acute distress.  HENT:  Normocephalic, atraumatic, External right and left ear normal. Oropharynx is clear and moist EYES: Conjunctivae and EOM are normal. Pupils are equal, round, and reactive to light. No scleral icterus.  NECK: Normal range of motion, supple, no masses SKIN: Skin is warm and dry. No rash noted. Not diaphoretic. No erythema. No pallor. NEUROLGIC: Alert and oriented to person, place, and time. Normal reflexes, muscle tone coordination. No cranial nerve deficit noted. PSYCHIATRIC: Normal mood and affect. Normal behavior. Normal judgment and thought content. CARDIOVASCULAR: Normal heart rate noted, regular rhythm RESPIRATORY: Effort and breath sounds normal, no problems with respiration noted ABDOMEN: Soft, nontender, nondistended, gravid. MUSCULOSKELETAL: Normal range of motion. No edema and no tenderness. 2+ distal pulses.  Cervical Exam: Dilatation 0 cm   Effacement  50%   Station -3   Presentation: cephalic FHT:  Baseline rate 135 bpm   Variability moderate (with periods of minimal variability) Accelerations present   Decelerations: multiple deep variable decelerations from baseline to 90s, some lasting up to 3 min. Contractions: Irregular   Pertinent Labs/Studies:   Results for orders placed or performed during the hospital encounter of 01/26/15 (from the past 24 hour(s))  CBC     Status: Abnormal   Collection Time: 01/26/15 10:38 PM  Result Value Ref Range   WBC 12.5 (H) 3.6 - 11.0 K/uL   RBC 3.94 3.80 - 5.20 MIL/uL   Hemoglobin 13.3 12.0 - 16.0 g/dL   HCT 11.9 14.7 - 82.9 %   MCV 98.0 80.0 - 100.0 fL   MCH 33.9 26.0 - 34.0 pg   MCHC 34.6 32.0 - 36.0 g/dL    RDW 56.2 13.0 - 86.5 %   Platelets 197 150 - 440 K/uL  Comprehensive metabolic panel     Status: Abnormal   Collection Time: 01/26/15 10:38 PM  Result Value Ref Range   Sodium 135 135 - 145 mmol/L   Potassium 3.6 3.5 - 5.1 mmol/L   Chloride 106 101 - 111 mmol/L   CO2 20 (L) 22 - 32 mmol/L   Glucose, Bld 80 65 - 99 mg/dL   BUN 16 6 - 20 mg/dL   Creatinine, Ser 7.84 0.44 - 1.00 mg/dL   Calcium 8.9 8.9 - 69.6 mg/dL   Total  Protein 7.0 6.5 - 8.1 g/dL   Albumin 3.0 (L) 3.5 - 5.0 g/dL   AST 45 (H) 15 - 41 U/L   ALT 38 14 - 54 U/L   Alkaline Phosphatase 138 (H) 38 - 126 U/L   Total Bilirubin 0.6 0.3 - 1.2 mg/dL   GFR calc non Af Amer >60 >60 mL/min   GFR calc Af Amer >60 >60 mL/min   Anion gap 9 5 - 15  Type and screen     Status: None   Collection Time: 01/26/15 10:38 PM  Result Value Ref Range   ABO/RH(D) A NEG    Antibody Screen POS    Sample Expiration 01/29/2015    Antibody Identification PASSIVELY ACQUIRED ANTI-D   ABO/Rh     Status: None   Collection Time: 01/26/15 10:38 PM  Result Value Ref Range   ABO/RH(D) A NEG    Lab Results  Component Value Date   LABURIC 4.7 01/26/2015   Results for Sydney, Lynn (MRN [id number]) as of 01/27/2015 08:50  Ref. Range 01/26/2015 10:04  Protein Urine Random Latest Ref Range: Not Estab. mg/dL 5.3  Creatinine, Urine Latest Ref Range: Not Estab. mg/dL 14.7  Protein/Creat Ratio Latest Ref Range: 0-200 mg/g creat 157    Assessment : Sydney Lynn is a 40 y.o. G1P0 at [redacted]w[redacted]d who was admitted for IOL for IUGR, mild pre-eclampsia, and oligohydramnios. Recurrent variable decelerations, non-reassuring fetal tracing.   Plan: Labor: Attempted induction with Cervidil, however removed after 1 hour due to recurrent decelerations.  Decelerations continued intermittently throughout the night, not allowing for resumption of Cervidil.  Discussion had with patient regarding fetal tracing, and the likelihood of fetal intolerance to further attempts to induce  labor.  Discussed recommendations of proceeding with C-section.  Patient in agreement with plan.  FWB: Non-reassuring fetal tracing (Category II).  GBS negative Delivery plan: Will plan for C-section.  The risks of cesarean section discussed with the patient included but were not limited to: bleeding which may require transfusion or reoperation; infection which may require antibiotics; injury to bowel, bladder, ureters or other surrounding organs; injury to the fetus; need for additional procedures including hysterectomy in the event of a life-threatening hemorrhage; placental abnormalities wth subsequent pregnancies, incisional problems, thromboembolic phenomenon and other postoperative/anesthesia complications. The patient concurred with the proposed plan, giving informed written consent for the procedure. Anesthesia and OR aware. Preoperative prophylactic antibiotics and SCDs ordered on call to the OR.  To OR when ready. Mild Pre-eclampsia: Patient with labile BPs (elevated in office but wnl currently), urine P/C ratio has actually decreased (previously 357 mg/g).  Will treat with Labetalol if elevated blood pressures occur.   Hildred Laser, MD [facility] 01/27/2015 9:02 AM

## 2015-01-27 NOTE — Op Note (Signed)
Cesarean Section Procedure Note  Indications: non-reassuring fetal status  Pre-operative Diagnosis: 37 week 0 day pregnancy, oligohydramnios, IUGR, AMA status, Rh negative.  Post-operative Diagnosis: same  Surgeon: Hildred Laser, MD  Assistants: None  Procedure: Primary/Repeat low transverse Cesarean Section  Anesthesia: Spinal anesthesia  ASA Class: II  Procedure Details: The patient was seen in the Holding Room. The risks, benefits, complications, treatment options, and expected outcomes were discussed with the patient.  The patient concurred with the proposed plan, giving informed consent.  The site of surgery properly noted/marked. The patient was taken to the Operating Room, identified as Layia [place]a and the procedure verified as C-Section Delivery. A Time Out was held and the above information confirmed.  After induction of anesthesia, the patient was draped and prepped in the usual sterile manner. Anesthesia was tested and noted to be adequate. A Pfannenstiel incision was made and carried down through the subcutaneous tissue to the fascia. Fascial incision was made and extended transversely. The fascia was separated from the underlying rectus tissue superiorly and inferiorly. The peritoneum was identified and entered. Peritoneal incision was extended longitudinally. The utero-vesical peritoneal reflection was incised transversely and the bladder flap was bluntly freed from the lower uterine segment. A low transverse uterine incision was made. Delivered from cephalic presentation was a 2180 gram Female with Apgar scores of 3 at one minute, 6 at five minutes, and 9 at ten minutes. .After the umbilical cord was clamped and cut cord blood was obtained for evaluation.  Cord blood was also collected for [facility]. The placenta was removed intact and appeared normal. The uterus was exteriorized and cleared of all clots and debris. The uterine outline, tubes and ovaries appeared normal.   The uterine incision was closed with running locked sutures of 0-Vicryl.  A second suture of 0-Vicryl was used in an imbricating layer.  Hemostasis was observed. Lavage was carried out until clear. The fascia was then reapproximated with a running suture of 0 Vicryl. The skin was reapproximated with 4-0 Monocryl.  Instrument, sponge, and needle counts were correct prior the abdominal closure and at the conclusion of the case.   Findings: Female infant, cephalic presentation, 2180 grams, with Apgar scores of 3 at one minute,  5 at five minutes,  and 9 at five minutes. Intact placenta with 3 vesnsel cord.  The uterine outline, tubes and ovaries appeared normal.   Estimated Blood Loss:  300 ml      Drains: foley catheter to gravity drainage, 150 cc clear urine at end of the procedure         Total IV Fluids:  1500 ml  Specimens: Placenta and Disposition:  Sent to Pathology         Implants: None         Complications:  None; patient tolerated the procedure well.         Disposition: PACU - hemodynamically stable.         Condition: stable    Hildred Laser, MD [facility]

## 2015-01-28 LAB — CBC
HCT: 37 % (ref 35.0–47.0)
HEMOGLOBIN: 12.7 g/dL (ref 12.0–16.0)
MCH: 33.6 pg (ref 26.0–34.0)
MCHC: 34.3 g/dL (ref 32.0–36.0)
MCV: 98 fL (ref 80.0–100.0)
PLATELETS: 152 10*3/uL (ref 150–440)
RBC: 3.77 MIL/uL — ABNORMAL LOW (ref 3.80–5.20)
RDW: 13.7 % (ref 11.5–14.5)
WBC: 12.7 10*3/uL — ABNORMAL HIGH (ref 3.6–11.0)

## 2015-01-28 LAB — RPR: RPR: NONREACTIVE

## 2015-01-28 NOTE — Anesthesia Post-op Follow-up Note (Signed)
  Anesthesia Pain Follow-up Note  Patient: Sydney Lynn  Day #: 1  Date of Follow-up: 01/28/2015 Time: 12:40 PM  Last Vitals:  Filed Vitals:   01/28/15 0723 01/28/15 1209  BP: 131/72 124/77  Pulse: 60 86  Temp: 37.1 C 36.9 C  Resp: 18     Level of Consciousness: alert  Pain: mild   Side Effects:None  Catheter Site Exam:clean, dry, no drainage  Plan: D/C from anesthesia care  Lenard Simmer

## 2015-01-28 NOTE — Anesthesia Postprocedure Evaluation (Signed)
Anesthesia Post Note  Patient: Ellan Tess  Procedure(s) Performed: Procedure(s) (LRB): CESAREAN SECTION (N/A)  Patient location during evaluation: [facility] Anesthesia Type: Spinal Level of consciousness: awake and alert Pain management: satisfactory to patient Vital Signs Assessment: post-procedure vital signs reviewed and stable Respiratory status: spontaneous breathing, nonlabored ventilation, respiratory function stable and patient connected to nasal cannula oxygen Cardiovascular status: blood pressure returned to baseline and stable Postop Assessment: no signs of nausea or vomiting, patient able to bend at knees, no backache and no headache Anesthetic complications: no    Last Vitals:  Filed Vitals:   01/28/15 0723 01/28/15 1209  BP: 131/72 124/77  Pulse: 60 86  Temp: 37.1 C 36.9 C  Resp: 18     Last Pain:  Filed Vitals:   01/28/15 1210  PainSc: 0-No pain                 Lenard Simmer

## 2015-01-28 NOTE — Progress Notes (Signed)
Postpartum Day #1: Cesarean Delivery  Subjective: Patient reports tolerating PO, + flatus and no problems voiding.    Objective: Vital signs in last 24 hours: Temp:  [98 F (36.7 C)-99.2 F (37.3 C)] 98.5 F (36.9 C) (01/21 1209) Pulse Rate:  [58-103] 86 (01/21 1209) Resp:  [18-20] 18 (01/21 0723) BP: (115-160)/(67-84) 124/77 mmHg (01/21 1209) SpO2:  [93 %-100 %] 98 % (01/21 1209)  Physical Exam:  General: alert and no distress Lochia: appropriate Uterine Fundus: firm Incision: healing well, no significant drainage, no dehiscence, no significant erythema DVT Evaluation: No evidence of DVT seen on physical exam.   Recent Labs  01/26/15 2238 01/28/15 0634  HGB 13.3 12.7  HCT 38.6 37.0    Assessment/Plan: Status post Cesarean section. Doing well postoperatively.  Continue current care. Infant in NICU for IUGR.  Does desires circumcision for female infant.  Plans to breastfeed.  Desires to use condoms for contraception.     Hildred Laser 01/28/2015, 1:42 PM

## 2015-01-29 LAB — CULTURE, BETA STREP (GROUP B ONLY): Strep Gp B Culture: NEGATIVE

## 2015-01-29 MED ORDER — OXYCODONE-ACETAMINOPHEN 5-325 MG PO TABS: 1.0000 | ORAL_TABLET | Freq: Four times a day (QID) | ORAL | Status: DC | PRN

## 2015-01-29 MED ORDER — DOCUSATE SODIUM 100 MG PO CAPS: 100.0000 mg | ORAL_CAPSULE | Freq: Two times a day (BID) | ORAL | Status: DC | PRN

## 2015-01-29 MED ORDER — IBUPROFEN 800 MG PO TABS: 800.0000 mg | ORAL_TABLET | Freq: Three times a day (TID) | ORAL | Status: DC | PRN

## 2015-01-29 NOTE — Progress Notes (Signed)
Patient discharged home, infant to remain in SCN. Discharge instructions, prescriptions and follow up appointment given to and reviewed with patient and family. Patient verbalized understanding. Escorted out via wheelchair by Velda Shell, RN.

## 2015-01-29 NOTE — Progress Notes (Signed)
Postpartum Day #2: Cesarean Delivery  Subjective: Patient reports tolerating PO, + flatus, + BM and no problems voiding.    Objective: Vital signs in last 24 hours: Temp:  [97.7 F (36.5 C)-98.6 F (37 C)] 97.7 F (36.5 C) (01/22 1007) Pulse Rate:  [61-86] 63 (01/22 1007) Resp:  [16-18] 16 (01/22 0342) BP: (111-126)/(60-77) 123/71 mmHg (01/22 1007) SpO2:  [97 %-100 %] 100 % (01/22 1007)  Physical Exam:  General: alert and no distress Lochia: appropriate Uterine Fundus: firm Incision: healing well, no significant drainage, no dehiscence, no significant erythema DVT Evaluation: No evidence of DVT seen on physical exam.   Recent Labs  01/26/15 2238 01/28/15 0634  HGB 13.3 12.7  HCT 38.6 37.0    Assessment/Plan: Status post Cesarean section. Doing well postoperatively.  Continue current care. Infant in NICU for IUGR.  Does desires circumcision for female infant.  Plans to breastfeed.  Desires to use condoms for contraception.  Dispo: Plan for d/c home tomorrow.     Hildred Laser 01/29/2015, 10:59 AM

## 2015-01-29 NOTE — Discharge Summary (Signed)
Obstetric Discharge Summary Reason for Admission: induction of labor for IUGR, oligohydramnios, and mild pre-eclampsia Prenatal Procedures: NST and ultrasound Intrapartum Procedures: cesarean: low cervical, transverse Postpartum Procedures: none Complications-Operative and Postpartum: none HEMOGLOBIN  Date Value Ref Range Status  01/28/2015 12.7 12.0 - 16.0 g/dL Final   HCT  Date Value Ref Range Status  01/28/2015 37.0 35.0 - 47.0 % Final   HEMATOCRIT  Date Value Ref Range Status  01/26/2015 38.6 34.0 - 46.6 % Final    Physical Exam:  General: alert and no distress Lochia: appropriate Uterine Fundus: firm Incision: healing well, no significant drainage, no dehiscence, no significant erythema DVT Evaluation: No evidence of DVT seen on physical exam. Negative Homan's sign. No cords or calf tenderness. No significant calf/ankle edema.  Discharge Diagnoses: Term Pregnancy-delivered and Preelampsia (mild)  Discharge Information: Date: 01/29/2015 Activity: pelvic rest Diet: routine Medications: PNV, Ibuprofen, Colace and Percocet Condition: stable Instructions: refer to practice specific booklet Discharge to: home Follow-up Information    Follow up with Hildred Laser, MD In 1 week.   Specialties:  Obstetrics and Gynecology, Radiology   Why:  For wound re-check   Contact information:   [address] [place] [address] [phone]       Newborn Data: Live born female  Birth Weight: 4 lb 12.9 oz (2180 g) APGAR: 3, 6  Infant to remain in NICU for low birth weight.  Hildred Laser 01/29/2015, 12:46 PM

## 2015-01-30 LAB — SURGICAL PATHOLOGY

## 2015-01-31 ENCOUNTER — Ambulatory Visit: Payer: Self-pay

## 2015-01-31 NOTE — Lactation Note (Signed)
This note was copied from the chart of Sydney Lynn. Lactation Consultation Note  Patient Name: Sydney Lynn [id number] Date: 01/31/2015     Maternal Data  Mom last pumped 3 hours ago. (states she gets 55m on left side; drops on other. PP day 4)  Feeding  Baby has a sleepy state and weak posture, but a little rooting exhibited. We tried nursing with sns without nipple shield at first and he struggled to latch and stay latched, so I added 20 mm nipple shield over SNS. He did better with this scenario, but he still needed much coaxing as he didn't seem very interested. He would have a few rallies of strong nursing and then resort back to weak sucks. He took maybe 8 ml from sns before we stopped attempts (approx 15 minutes of trying at breast), and switched him to slow flow nipple. He took a total of 35 ml that feeding. Mom to keep pumjping at least 8 times a day. She had not been pumping at cribside, but plans to bring in her second pump kit so she can use [employer] pump during her visits. Lots of skin to skin suggested. I also encouraged her to only focus on much time with  breastfeeding attempts when he is alert and active , not real sleepy/weak.   LNov[facility] HospitalScore/Interventions                      Lactation Tools Discussed/Used     Consult Status      SRoque Cash1/24/2017, 3:47 PM

## 2015-02-02 ENCOUNTER — Ambulatory Visit (INDEPENDENT_AMBULATORY_CARE_PROVIDER_SITE_OTHER): Payer: BC Managed Care – PPO | Admitting: Obstetrics and Gynecology

## 2015-02-02 ENCOUNTER — Ambulatory Visit: Payer: BC Managed Care – PPO

## 2015-02-02 ENCOUNTER — Encounter: Payer: BC Managed Care – PPO | Admitting: Obstetrics and Gynecology

## 2015-02-02 VITALS — BP 129/81 | HR 78 | Wt 162.1 lb

## 2015-02-02 DIAGNOSIS — Z9889 Other specified postprocedural states: Secondary | ICD-10-CM

## 2015-02-02 DIAGNOSIS — O1404 Mild to moderate pre-eclampsia, complicating childbirth: Secondary | ICD-10-CM

## 2015-02-02 DIAGNOSIS — Z98891 History of uterine scar from previous surgery: Secondary | ICD-10-CM

## 2015-02-03 NOTE — Progress Notes (Signed)
   OBSTETRIC POST-OPERATIVE VISIT   Subjective:     Sydney Lynn is a 40 y.o. G25P1001 female who presents to the clinic 1 weeks status post primary low transverse C-section for fetal intolerance to labor. Eating a regular diet without difficulty. Bowel movements are normal. Pain is controlled with current analgesics. Medications being used: prescription NSAID's including ibuprofen (Motrin) and narcotic analgesics including Percocet.   The following portions of the patient's history were reviewed and updated as appropriate: allergies, current medications, past family history, past medical history, past social history, past surgical history and problem list.  Review of Systems Pertinent items noted in HPI and remainder of comprehensive ROS otherwise negative.    Objective:    BP 129/81 mmHg  Pulse 78  Wt 162 lb 2 oz (73.539 kg) General:  alert and no distress  Abdomen: soft, bowel sounds active, non-tender  Incision:   healing well, no drainage, no erythema, no hernia, no seroma, no swelling, no dehiscence, incision well approximated     Assessment:    Doing well postoperatively. S/p primary C-section Mild-preclampsia, delivered  Plan:   1. Continue any current medications. 2. Wound care discussed. 3. Activity restrictions: no bending, stooping, or squatting, no lifting more than 15 pounds and pelvic rest x  5 weeks 4. Anticipated return to work: 5 weeks. 5. BPs wnl today.  6. Follow up: 5 weeks for postpartum visit.     Hildred Laser, MD [facility]

## 2015-02-08 ENCOUNTER — Encounter: Payer: BC Managed Care – PPO | Admitting: Obstetrics and Gynecology

## 2015-02-14 ENCOUNTER — Encounter: Payer: BC Managed Care – PPO | Admitting: Obstetrics and Gynecology

## 2015-03-09 ENCOUNTER — Ambulatory Visit (INDEPENDENT_AMBULATORY_CARE_PROVIDER_SITE_OTHER): Payer: BC Managed Care – PPO | Admitting: Obstetrics and Gynecology

## 2015-03-09 ENCOUNTER — Encounter: Payer: Self-pay | Admitting: Obstetrics and Gynecology

## 2015-03-09 DIAGNOSIS — Z9189 Other specified personal risk factors, not elsewhere classified: Secondary | ICD-10-CM

## 2015-03-09 NOTE — Progress Notes (Signed)
   OBSTETRICS POSTPARTUM CLINIC PROGRESS NOTE  Subjective:     Sydney Lynn is a 40 y.o. G90P1001 female who presents for a postpartum visit. She is 6 weeks postpartum following a low cervical transverse Cesarean section. I have fully reviewed the prenatal and intrapartum course. The delivery was at 37.0 gestational weeks. The pregnancy was complicated by IUGR, mild pre-eclampsia, and oligohydramnios. Anesthesia: spinal. Postpartum course has been well. Baby's course has been well otherwise (spent first 10 days of life in NICU). Baby is feeding by both breast and bottle. Does note difficultiew with breastfeeding (decreased milk supply.  Has tried several OTC lactagogues.  Bleeding: patient has not resumed menses. Bowel function is normal. Bladder function is normal. Patient is not sexually active. Contraception method desired is none/  Plans to attempt conception again soon (notes difficulty in conceiving 1st child). Postpartum depression screening: negative.  PHQ-9 score is 1.   The following portions of the patient's history were reviewed and updated as appropriate: allergies, current medications, past family history, past medical history, past social history, past surgical history and problem list.  Review of Systems Pertinent items noted in HPI and remainder of comprehensive ROS otherwise negative.   Objective:    BP 105/63 mmHg  Pulse 74  Ht  (1.626 m)  Wt 158 lb 6.4 oz (71.85 kg)  BMI 27.18 kg/m2  Breastfeeding? No  General:  alert and no distress   Breasts:  inspection negative, no nipple discharge or bleeding, no masses or nodularity palpable  Lungs: clear to auscultation bilaterally  Heart:  regular rate and rhythm, S1, S2 normal, no murmur, click, rub or gallop  Abdomen: soft, non-tender; bowel sounds normal; no masses,  no organomegaly.  Well healed Pfannenstiel incision   Vulva:  normal  Vagina: normal vagina, no discharge, exudate, lesion, or erythema  Cervix:  no cervical  motion tenderness and no lesions  Corpus: normal size, contour, position, consistency, mobility, non-tender  Adnexa:  normal adnexa and no mass, fullness, tenderness  Rectal Exam: Not performed.         Labs:  Lab Results  Component Value Date   HGB 12.7 01/28/2015     Assessment:   Routine postpartum exam.   Postpartum care following cesarean delivery Breastfeeding problem (decreased milk supply)  Plan:    1. Contraception: none.  Advised patient to allow at least 3-6 months prior to attempting conception again.  2. Discussed using Fenugreek (patient has not tried).  If no success, can prescribe Reglan.   3. Follow up: as needed.  To schedule appointment with [facility] (previously seen there prior to pregnancy) to re-establish care and for annual exam in next 2-3 months.      Hildred Laser, MD [facility]

## 2015-03-10 ENCOUNTER — Telehealth: Payer: Self-pay | Admitting: Obstetrics and Gynecology

## 2015-03-10 DIAGNOSIS — O9279 Other disorders of lactation: Secondary | ICD-10-CM

## 2015-03-10 NOTE — Telephone Encounter (Signed)
PT CALLED AND SHE HERE YESTERDAY AND DR CHERRY TOLD HER TO TAKE FENUGREK TO INCREASE BREAST MILK AND TO CALL US IF IT DIDN'T HELP, AND SHE WOULD CALL IN SOMETHING AND SHE IS STATING THAT IT ISN'T HELPING.

## 2015-03-13 MED ORDER — METOCLOPRAMIDE HCL 10 MG PO TABS: 10.0000 mg | ORAL_TABLET | Freq: Three times a day (TID) | ORAL | Status: DC

## 2015-03-13 NOTE — Telephone Encounter (Signed)
Called pt she states that she was told that if she took Fenugreek she would see an increase in her milk supply in 24 hours, advised pt that that in my experience it can take up to 2 weeks for mothers to notice an increase in supply. However if she is ready to begin Reglan I can send a script in. Pt states that she is ready to try prescription medication. RX sent in, advised pt to watch for signs of depression as this is a side effect with this medication. Pt gave verbal understanding.

## 2015-10-10 ENCOUNTER — Ambulatory Visit (INDEPENDENT_AMBULATORY_CARE_PROVIDER_SITE_OTHER): Payer: BC Managed Care – PPO | Admitting: Obstetrics and Gynecology

## 2015-10-10 ENCOUNTER — Encounter: Payer: Self-pay | Admitting: Obstetrics and Gynecology

## 2015-10-10 VITALS — BP 128/79 | HR 79 | Ht 64.0 in | Wt 137.7 lb

## 2015-10-10 DIAGNOSIS — R110 Nausea: Secondary | ICD-10-CM

## 2015-10-10 DIAGNOSIS — O09291 Supervision of pregnancy with other poor reproductive or obstetric history, first trimester: Secondary | ICD-10-CM | POA: Diagnosis not present

## 2015-10-10 DIAGNOSIS — Z8709 Personal history of other diseases of the respiratory system: Secondary | ICD-10-CM

## 2015-10-10 DIAGNOSIS — B009 Herpesviral infection, unspecified: Secondary | ICD-10-CM | POA: Diagnosis not present

## 2015-10-10 DIAGNOSIS — O09521 Supervision of elderly multigravida, first trimester: Secondary | ICD-10-CM | POA: Diagnosis not present

## 2015-10-10 DIAGNOSIS — O09899 Supervision of other high risk pregnancies, unspecified trimester: Secondary | ICD-10-CM | POA: Diagnosis not present

## 2015-10-10 DIAGNOSIS — O26899 Other specified pregnancy related conditions, unspecified trimester: Secondary | ICD-10-CM

## 2015-10-10 DIAGNOSIS — O09529 Supervision of elderly multigravida, unspecified trimester: Secondary | ICD-10-CM | POA: Insufficient documentation

## 2015-10-10 DIAGNOSIS — Z3201 Encounter for pregnancy test, result positive: Secondary | ICD-10-CM | POA: Diagnosis not present

## 2015-10-10 DIAGNOSIS — Z6791 Unspecified blood type, Rh negative: Secondary | ICD-10-CM

## 2015-10-10 DIAGNOSIS — Z87728 Personal history of other specified (corrected) congenital malformations of nervous system and sense organs: Secondary | ICD-10-CM

## 2015-10-10 LAB — POCT URINE PREGNANCY: PREG TEST UR: POSITIVE — AB

## 2015-10-10 MED ORDER — VITAMIN B-6 50 MG PO TABS: 50.0000 mg | ORAL_TABLET | Freq: Three times a day (TID) | ORAL | 2 refills | Status: DC | PRN

## 2015-10-10 MED ORDER — DOXYLAMINE SUCCINATE (SLEEP) 25 MG PO TABS: 25.0000 mg | ORAL_TABLET | Freq: Every evening | ORAL | 2 refills | Status: DC | PRN

## 2015-10-10 MED ORDER — FOLIC ACID 1 MG PO TABS: 1.0000 mg | ORAL_TABLET | Freq: Every day | ORAL | 10 refills | Status: DC

## 2015-10-10 NOTE — Progress Notes (Signed)
GYNECOLOGY CLINIC PROGRESS NOTE  Subjective:    Sydney Lynn is a 40 y.o. female who presents for evaluation of amenorrhea. She believes she could be pregnant. Pregnancy is desired. Sexual Activity: single partner, contraception: none. Current symptoms also include: fatigue, nausea and positive home pregnancy test. Last period was normal. Patient's last menstrual period was 08/21/2015.    The following portions of the patient's history were reviewed and updated as appropriate:   OB History  Gravida Para Term Preterm AB Living  1 1 1     1   SAB TAB Ectopic Multiple Live Births        0 1    # Outcome Date GA Lbr Len/2nd Weight Sex Delivery Anes PTL Lv  1 Term 01/27/15 [id number]  4 lb 12.9 oz (2.18 kg) M CS-LTranv Spinal  LIV     Complications: Fetal Intolerance to induction of labor (as reason for C-section),IUGR (intrauterine growth restriction) affecting care of mother, third trimester, mild pre-eclampsia      She  has a past medical history of Asthma; H/O spina bifida; HSV-2 infection; and Recurrent upper respiratory infection (URI).   She  has a past surgical history that includes Nasal sinus surgery and Cesarean section (N/A, 01/27/2015).   Her family history includes Hypertension in her mother; Spina bifida in her brother and father. She  reports that she has never smoked. She has never used smokeless tobacco. She reports that she does not drink alcohol or use drugs.   She has a current medication list which includes the following prescription(s): cetirizine, epinephrine, prenatal vitamin w/fe, fa, triamcinolone, valacyclovir, doxylamine (sleep), folic acid, and pyridoxine.   She is allergic to amoxicillin and bee venom..  Review of Systems A comprehensive review of systems was negative except for: what is noted in HPI     Objective:    BP 128/79 (BP Location: Left Arm, Patient Position: Sitting, Cuff Size: Normal)   Pulse 79   Ht 5\' 4"  (1.626 m)   Wt 137 lb 11.2 oz (62.5  kg)   LMP 08/21/2015   Breastfeeding? No   BMI 23.64 kg/m  General: alert, no distress and no acute distress    Lab Review Urine HCG: positive    Assessment:    Absence of menstruation with positive UPT.    Nausea of pregnancy Insomnia H/o HSV- II H/o prior C-section x 1 H/o IUGR in prior pregnancy H/o Rh negative status Advanced maternal age H/o asthma  Plan:   - Pregnancy Test: Positive: EDC: 05/28/2015, with EGA 7.1 weeks. Briefly discussed pre-natal care options. Pregnancy, Childbirth and the Newborn book given. Encouraged well-balanced diet, plenty of rest when needed, pre-natal vitamins daily and walking for exercise. Discussed self-help for nausea, avoiding OTC medications until consulting provider or pharmacist, other than Tylenol as needed, minimal caffeine (1-2 cups daily) and avoiding alcohol. She will schedule her initial OB visit in the next month with her PCP or OB provider. For NOB intake in 2-3 weeks. Feel free to call with any questions.  - Patient encouraged to also begin 1 mg folic acid daily (has personal h/o mild NTD, though no previously affected pregnancy) in addition to PNV.  - Prescribed vitamin B6 and doxylamine for nausea and vomiting (currently modifying diet and conskuming ginger without much relief). This should also help patient's insomnia, as she has discontinued her Ambien due to pregnancy. - H/o HSV-II, will need prophylaxis at 36 weeks.  - H/o prior C-section for fetal distress in early labor,  to discuss at NOB visit risks/benefits of TOLAC vs repeat C-section.  - H/o IUGR in prior pregnancy, will screen carefully for IUGR in prior pregnancy.  - Rh negative status in prior pregnancy, will need Rhogam again this pregnancy. - Strongly encouraged patient to have genetic testing this pregnancy due to AMA status, h/o spina bifida.  - H/o asthma, no recent exacerbations.  Usually exercise induced. Can use albuterol inhaler as needed.   Hildred Laser,  MD [facility]   A total of 15 minutes were spent face-to-face with the patient during this encounter and over half of that time involved counseling and coordination of care.

## 2015-10-23 ENCOUNTER — Ambulatory Visit (INDEPENDENT_AMBULATORY_CARE_PROVIDER_SITE_OTHER): Payer: BC Managed Care – PPO | Admitting: Obstetrics and Gynecology

## 2015-10-23 VITALS — BP 117/71 | HR 59 | Wt 137.8 lb

## 2015-10-23 DIAGNOSIS — Z369 Encounter for antenatal screening, unspecified: Secondary | ICD-10-CM

## 2015-10-23 DIAGNOSIS — Z331 Pregnant state, incidental: Secondary | ICD-10-CM

## 2015-10-23 DIAGNOSIS — Z113 Encounter for screening for infections with a predominantly sexual mode of transmission: Secondary | ICD-10-CM

## 2015-10-23 DIAGNOSIS — Z1389 Encounter for screening for other disorder: Secondary | ICD-10-CM

## 2015-10-23 DIAGNOSIS — Z8279 Family history of other congenital malformations, deformations and chromosomal abnormalities: Secondary | ICD-10-CM

## 2015-10-23 DIAGNOSIS — O09521 Supervision of elderly multigravida, first trimester: Secondary | ICD-10-CM

## 2015-10-23 DIAGNOSIS — O09291 Supervision of pregnancy with other poor reproductive or obstetric history, first trimester: Secondary | ICD-10-CM

## 2015-10-23 DIAGNOSIS — Z3491 Encounter for supervision of normal pregnancy, unspecified, first trimester: Secondary | ICD-10-CM

## 2015-10-23 DIAGNOSIS — Z87728 Personal history of other specified (corrected) congenital malformations of nervous system and sense organs: Secondary | ICD-10-CM

## 2015-10-23 NOTE — Patient Instructions (Signed)
Pregnancy and Zika Virus Disease Zika virus disease, or Zika, is an illness that can spread to people from mosquitoes that carry the virus. It may also spread from person to person through infected body fluids. Zika first occurred in [place], but recently it has spread to new areas. The virus occurs in tropical climates. The location of Zika continues to change. Most people who become infected with Zika virus do not develop serious illness. However, Zika may cause birth defects in an unborn baby whose mother is infected with the virus. It may also increase the risk of miscarriage. WHAT ARE THE SYMPTOMS OF ZIKA VIRUS DISEASE? In many cases, people who have been infected with Zika virus do not develop any symptoms. If symptoms appear, they usually start about a week after the person is infected. Symptoms are usually mild. They may include:  Fever.  Rash.  Red eyes.  Joint pain. HOW DOES ZIKA VIRUS DISEASE SPREAD? The main way that Zika virus spreads is through the bite of a certain type of mosquito. Unlike most types of mosquitos, which bite only at night, the type of mosquito that carries Zika virus bites both at night and during the day. Zika virus can also spread through sexual contact, through a blood transfusion, and from a mother to her baby before or during birth. Once you have had Zika virus disease, it is unlikely that you will get it again. CAN I PASS ZIKA TO MY BABY DURING PREGNANCY? Yes, Zika can pass from a mother to her baby before or during birth. WHAT PROBLEMS CAN ZIKA CAUSE FOR MY BABY? A woman who is infected with Zika virus while pregnant is at risk of having her baby born with a condition in which the brain or head is smaller than expected (microcephaly). Babies who have microcephaly can have developmental delays, seizures, hearing problems, and vision problems. Having Zika virus disease during pregnancy can also increase the risk of miscarriage. HOW CAN ZIKA VIRUS DISEASE BE  PREVENTED? There is no vaccine to prevent Zika. The best way to prevent the disease is to avoid infected mosquitoes and avoid exposure to body fluids that can spread the virus. Avoid any possible exposure to Zika by taking the following precautions. For women and their sex partners:  Avoid traveling to high-risk areas. The locations where Zika is being reported change often. To identify high-risk areas, check the CDC travel website: www.cdc.gov/zika/geo/index.html  If you or your sex partner must travel to a high-risk area, talk with a health care provider before and after traveling.  Take all precautions to avoid mosquito bites if you live in, or travel to, any of the high-risk areas. Insect repellents are safe to use during pregnancy.  Ask your health care provider when it is safe to have sexual contact. For women:  If you are pregnant or trying to become pregnant, avoid sexual contact with persons who may have been exposed to Zika virus, persons who have possible symptoms of Zika, or persons whose history you are unsure about. If you choose to have sexual contact with someone who may have been exposed to Zika virus, use condoms correctly during the entire duration of sexual activity, every time. Do not share sexual devices, as you may be exposed to body fluids.  Ask your health care provider about when it is safe to attempt pregnancy after a possible exposure to Zika virus. WHAT STEPS SHOULD I TAKE TO AVOID MOSQUITO BITES? Take these steps to avoid mosquito bites when you are   in a high-risk area:  Wear loose clothing that covers your arms and legs.  Limit your outdoor activities.  Do not open windows unless they have window screens.  Sleep under mosquito nets.  Use insect repellent. The best insect repellents have:  DEET, picaridin, oil of lemon eucalyptus (OLE), or [id number] in them.  Higher amounts of an active ingredient in them.  Remember that insect repellents are safe to use  during pregnancy.  Do not use OLE on children who are younger than 3 years of age. Do not use insect repellent on babies who are younger than 2 months of age.  Cover your child's stroller with mosquito netting. Make sure the netting fits snugly and that any loose netting does not cover your child's mouth or nose. Do not use a blanket as a mosquito-protection cover.  Do not apply insect repellent underneath clothing.  If you are using sunscreen, apply the sunscreen before applying the insect repellent.  Treat clothing with permethrin. Do not apply permethrin directly to your skin. Follow label directions for safe use.  Get rid of standing water, where mosquitoes may reproduce. Standing water is often found in items such as buckets, bowls, animal food dishes, and flowerpots. When you return from traveling to any high-risk area, continue taking actions to protect yourself against mosquito bites for 3 weeks, even if you show no signs of illness. This will prevent spreading Zika virus to uninfected mosquitoes. WHAT SHOULD I KNOW ABOUT THE SEXUAL TRANSMISSION OF ZIKA? People can spread Zika to their sexual partners during vaginal, anal, or oral sex, or by sharing sexual devices. Many people with Zika do not develop symptoms, so a person could spread the disease without knowing that they are infected. The greatest risk is to women who are pregnant or who may become pregnant. Zika virus can live longer in semen than it can live in blood. Couples can prevent sexual transmission of the virus by:  Using condoms correctly during the entire duration of sexual activity, every time. This includes vaginal, anal, and oral sex.  Not sharing sexual devices. Sharing increases your risk of being exposed to body fluid from another person.  Avoiding all sexual activity until your health care provider says it is safe. SHOULD I BE TESTED FOR ZIKA VIRUS? A sample of your blood can be tested for Zika virus. A pregnant  woman should be tested if she may have been exposed to the virus or if she has symptoms of Zika. She may also have additional tests done during her pregnancy, such ultrasound testing. Talk with your health care provider about which tests are recommended.   This information is not intended to replace advice given to you by your health care provider. Make sure you discuss any questions you have with your health care provider.   Document Released: 09/14/2014 Document Reviewed: 09/07/2014 Elsevier Interactive Patient Education 2016 [employer] Minor Illnesses and Medications in Pregnancy  Cold/Flu:  Sudafed for congestion- Robitussin (plain) for cough- Tylenol for discomfort.  Please follow the directions on the label.  Try not to take any more than needed.  OTC Saline nasal spray and air humidifier or cool-mist  Vaporizer to sooth nasal irritation and to loosen congestion.  It is also important to increase intake of non carbonated fluids, especially if you have a fever.  Constipation:  Colace-2 capsules at bedtime; Metamucil- follow directions on label; Senokot- 1 tablet at bedtime.  Any one of these medications can be used.  It is also   very important to increase fluids and fruits along with regular exercise.  If problem persists please call the office.  Diarrhea:  Kaopectate as directed on the label.  Eat a bland diet and increase fluids.  Avoid highly seasoned foods.  Headache:  Tylenol 1 or 2 tablets every 3-4 hours as needed  Indigestion:  Maalox, Mylanta, Tums or Rolaids- as directed on label.  Also try to eat small meals and avoid fatty, greasy or spicy foods.  Nausea with or without Vomiting:  Nausea in pregnancy is caused by increased levels of hormones in the body which influence the digestive system and cause irritation when stomach acids accumulate.  Symptoms usually subside after 1st trimester of pregnancy.  Try the following: 1. Keep saltines, graham crackers or dry toast by your bed  to eat upon awakening. 2. Don't let your stomach get empty.  Try to eat 5-6 small meals per day instead of 3 large ones. 3. Avoid greasy fatty or highly seasoned foods.  4. Take OTC Unisom 1 tablet at bed time along with OTC Vitamin B6 25-50 mg 3 times per day.    If nausea continues with vomiting and you are unable to keep down food and fluids you may need a prescription medication.  Please notify your provider.   Sore throat:  Chloraseptic spray, throat lozenges and or plain Tylenol.  Vaginal Yeast Infection:  OTC Monistat for 7 days as directed on label.  If symptoms do not resolve within a week notify provider.  If any of the above problems do not subside with recommended treatment please call the office for further assistance.   Do not take Aspirin, Advil, Motrin or Ibuprofen.  * * OTC= Over the counter Hyperemesis Gravidarum Hyperemesis gravidarum is a severe form of nausea and vomiting that happens during pregnancy. Hyperemesis is worse than morning sickness. It may cause you to have nausea or vomiting all day for many days. It may keep you from eating and drinking enough food and liquids. Hyperemesis usually occurs during the first half (the first 20 weeks) of pregnancy. It often goes away once a woman is in her second half of pregnancy. However, sometimes hyperemesis continues through an entire pregnancy.  CAUSES  The cause of this condition is not completely known but is thought to be related to changes in the body's hormones when pregnant. It could be from the high level of the pregnancy hormone or an increase in estrogen in the body.  SIGNS AND SYMPTOMS   Severe nausea and vomiting.  Nausea that does not go away.  Vomiting that does not allow you to keep any food down.  Weight loss and body fluid loss (dehydration).  Having no desire to eat or not liking food you have previously enjoyed. DIAGNOSIS  Your health care provider will do a physical exam and ask you about your  symptoms. He or she may also order blood tests and urine tests to make sure something else is not causing the problem.  TREATMENT  You may only need medicine to control the problem. If medicines do not control the nausea and vomiting, you will be treated in the hospital to prevent dehydration, increased acid in the blood (acidosis), weight loss, and changes in the electrolytes in your body that may harm the unborn baby (fetus). You may need IV fluids.  HOME CARE INSTRUCTIONS   Only take over-the-counter or prescription medicines as directed by your health care provider.  Try eating a couple of dry crackers or   toast in the morning before getting out of bed.  Avoid foods and smells that upset your stomach.  Avoid fatty and spicy foods.  Eat 5-6 small meals a day.  Do not drink when eating meals. Drink between meals.  For snacks, eat high-protein foods, such as cheese.  Eat or suck on things that have ginger in them. Ginger helps nausea.  Avoid food preparation. The smell of food can spoil your appetite.  Avoid iron pills and iron in your multivitamins until after 3-4 months of being pregnant. However, consult with your health care provider before stopping any prescribed iron pills. SEEK MEDICAL CARE IF:   Your abdominal pain increases.  You have a severe headache.  You have vision problems.  You are losing weight. SEEK IMMEDIATE MEDICAL CARE IF:   You are unable to keep fluids down.  You vomit blood.  You have constant nausea and vomiting.  You have excessive weakness.  You have extreme thirst.  You have dizziness or fainting.  You have a fever or persistent symptoms for more than 2-3 days.  You have a fever and your symptoms suddenly get worse. MAKE SURE YOU:   Understand these instructions.  Will watch your condition.  Will get help right away if you are not doing well or get worse.   This information is not intended to replace advice given to you by your  health care provider. Make sure you discuss any questions you have with your health care provider.   Document Released: 12/24/2004 Document Revised: 10/14/2012 Document Reviewed: 08/05/2012 Elsevier Interactive Patient Education 2016 [employer] Commonly Asked Questions During Pregnancy  Cats: A parasite can be excreted in cat feces.  To avoid exposure you need to have another person empty the little box.  If you must empty the litter box you will need to wear gloves.  Wash your hands after handling your cat.  This parasite can also be found in raw or undercooked meat so this should also be avoided.  Colds, Sore Throats, Flu: Please check your medication sheet to see what you can take for symptoms.  If your symptoms are unrelieved by these medications please call the office.  Dental Work: Most any dental work your dentist recommends is permitted.  X-rays should only be taken during the first trimester if absolutely necessary.  Your abdomen should be shielded with a lead apron during all x-rays.  Please notify your provider prior to receiving any x-rays.  Novocaine is fine; gas is not recommended.  If your dentist requires a note from us prior to dental work please call the office and we will provide one for you.  Exercise: Exercise is an important part of staying healthy during your pregnancy.  You may continue most exercises you were accustomed to prior to pregnancy.  Later in your pregnancy you will most likely notice you have difficulty with activities requiring balance like riding a bicycle.  It is important that you listen to your body and avoid activities that put you at a higher risk of falling.  Adequate rest and staying well hydrated are a must!  If you have questions about the safety of specific activities ask your provider.    Exposure to Children with illness: Try to avoid obvious exposure; report any symptoms to us when noted,  If you have chicken pos, red measles or mumps, you should  be immune to these diseases.   Please do not take any vaccines while pregnant unless you have checked with   your OB provider.  Fetal Movement: After 28 weeks we recommend you do "kick counts" twice daily.  Lie or sit down in a calm quiet environment and count your baby movements "kicks".  You should feel your baby at least 10 times per hour.  If you have not felt 10 kicks within the first hour get up, walk around and have something sweet to eat or drink then repeat for an additional hour.  If count remains less than 10 per hour notify your provider.  Fumigating: Follow your pest control agent's advice as to how long to stay out of your home.  Ventilate the area well before re-entering.  Hemorrhoids:   Most over-the-counter preparations can be used during pregnancy.  Check your medication to see what is safe to use.  It is important to use a stool softener or fiber in your diet and to drink lots of liquids.  If hemorrhoids seem to be getting worse please call the office.   Hot Tubs:  Hot tubs Jacuzzis and saunas are not recommended while pregnant.  These increase your internal body temperature and should be avoided.  Intercourse:  Sexual intercourse is safe during pregnancy as long as you are comfortable, unless otherwise advised by your provider.  Spotting may occur after intercourse; report any bright red bleeding that is heavier than spotting.  Labor:  If you know that you are in labor, please go to the hospital.  If you are unsure, please call the office and let us help you decide what to do.  Lifting, straining, etc:  If your job requires heavy lifting or straining please check with your provider for any limitations.  Generally, you should not lift items heavier than that you can lift simply with your hands and arms (no back muscles)  Painting:  Paint fumes do not harm your pregnancy, but may make you ill and should be avoided if possible.  Latex or water based paints have less odor than oils.   Use adequate ventilation while painting.  Permanents & Hair Color:  Chemicals in hair dyes are not recommended as they cause increase hair dryness which can increase hair loss during pregnancy.  " Highlighting" and permanents are allowed.  Dye may be absorbed differently and permanents may not hold as well during pregnancy.  Sunbathing:  Use a sunscreen, as skin burns easily during pregnancy.  Drink plenty of fluids; avoid over heating.  Tanning Beds:  Because their possible side effects are still unknown, tanning beds are not recommended.  Ultrasound Scans:  Routine ultrasounds are performed at approximately 20 weeks.  You will be able to see your baby's general anatomy an if you would like to know the gender this can usually be determined as well.  If it is questionable when you conceived you may also receive an ultrasound early in your pregnancy for dating purposes.  Otherwise ultrasound exams are not routinely performed unless there is a medical necessity.  Although you can request a scan we ask that you pay for it when conducted because insurance does not cover " patient request" scans.  Work: If your pregnancy proceeds without complications you may work until your due date, unless your physician or employer advises otherwise.  Round Ligament Pain/Pelvic Discomfort:  Sharp, shooting pains not associated with bleeding are fairly common, usually occurring in the second trimester of pregnancy.  They tend to be worse when standing up or when you remain standing for long periods of time.  These are the result   of pressure of certain pelvic ligaments called "round ligaments".  Rest, Tylenol and heat seem to be the most effective relief.  As the womb and fetus grow, they rise out of the pelvis and the discomfort improves.  Please notify the office if your pain seems different than that described.  It may represent a more serious condition.   

## 2015-10-23 NOTE — Progress Notes (Signed)
Patient ID: Sydney Lynn, female   DOB: 09/14/1975, 40 y.o.   MRN: [id number]  Sydney BigJill Galligan presents for NOB nurse interview visit. Pregnancy confirmation done by Dr. Valentino Saxonherry on 10/10/2015. LMP: 08/21/2015. EDD: 05/27/2016.  G-2.  P-1001. Pregnancy education material explained and given. No cats in the home. NOB labs ordered. HIV labs and Drug screen were explained optional and she did not decline. Drug screen ordered. PNV encouraged. Genetic screening discussed with Dr. Valentino Saxonherry and pt would like to do the Panorama. Pt will do this at ultrasound dating if at least 10wks.  Ultrasound also ordered for dating and viability due to AMA, HX IUGR, H/O Spina Bifida and FHX Spina Bifida.   Pt. To follow up with provider on 11/16/2015 for NOB physical.  All questions answered.

## 2015-10-24 ENCOUNTER — Other Ambulatory Visit: Payer: BC Managed Care – PPO

## 2015-10-24 LAB — VARICELLA ZOSTER ANTIBODY, IGG: Varicella zoster IgG: 1291 index (ref >165)

## 2015-10-25 ENCOUNTER — Ambulatory Visit (INDEPENDENT_AMBULATORY_CARE_PROVIDER_SITE_OTHER): Payer: BC Managed Care – PPO

## 2015-10-25 DIAGNOSIS — O09521 Supervision of elderly multigravida, first trimester: Secondary | ICD-10-CM | POA: Diagnosis not present

## 2015-10-25 DIAGNOSIS — Z8279 Family history of other congenital malformations, deformations and chromosomal abnormalities: Secondary | ICD-10-CM

## 2015-10-25 DIAGNOSIS — Z369 Encounter for antenatal screening, unspecified: Secondary | ICD-10-CM | POA: Diagnosis not present

## 2015-10-25 DIAGNOSIS — Z87728 Personal history of other specified (corrected) congenital malformations of nervous system and sense organs: Secondary | ICD-10-CM

## 2015-10-25 DIAGNOSIS — O09291 Supervision of pregnancy with other poor reproductive or obstetric history, first trimester: Secondary | ICD-10-CM | POA: Diagnosis not present

## 2015-10-25 LAB — MONITOR DRUG PROFILE 14(MW)
Amphetamine Scrn, Ur: NEGATIVE ng/mL
BARBITURATE SCREEN URINE: NEGATIVE ng/mL
BENZODIAZEPINE SCREEN, URINE: NEGATIVE ng/mL
BUPRENORPHINE, URINE: NEGATIVE ng/mL
CANNABINOIDS UR QL SCN: NEGATIVE ng/mL
COCAINE(METAB.)SCREEN, URINE: NEGATIVE ng/mL
CREATININE(CRT), U: 48.7 mg/dL (ref 20.0–300.0)
Fentanyl, Urine: NEGATIVE pg/mL
METHADONE SCREEN, URINE: NEGATIVE ng/mL
Meperidine Screen, Urine: NEGATIVE ng/mL
OXYCODONE+OXYMORPHONE UR QL SCN: NEGATIVE ng/mL
Opiate Scrn, Ur: NEGATIVE ng/mL
PH UR, DRUG SCRN: 6.1 (ref 4.5–8.9)
PHENCYCLIDINE QUANTITATIVE URINE: NEGATIVE ng/mL
Propoxyphene Scrn, Ur: NEGATIVE ng/mL
SPECIFIC GRAVITY: 1.013
Tramadol Screen, Urine: NEGATIVE ng/mL

## 2015-10-25 LAB — CBC WITH DIFFERENTIAL/PLATELET
BASOS ABS: 0 10*3/uL (ref 0.0–0.2)
Basos: 0 %
EOS (ABSOLUTE): 0.1 10*3/uL (ref 0.0–0.4)
Eos: 1 %
Hematocrit: 39.9 % (ref 34.0–46.6)
Hemoglobin: 14.3 g/dL (ref 11.1–15.9)
Immature Grans (Abs): 0 10*3/uL (ref 0.0–0.1)
Immature Granulocytes: 0 %
LYMPHS ABS: 3.1 10*3/uL (ref 0.7–3.1)
LYMPHS: 39 %
MCH: 32.2 pg (ref 26.6–33.0)
MCHC: 35.8 g/dL — AB (ref 31.5–35.7)
MCV: 90 fL (ref 79–97)
Monocytes Absolute: 0.6 10*3/uL (ref 0.1–0.9)
Monocytes: 8 %
NEUTROS ABS: 4 10*3/uL (ref 1.4–7.0)
Neutrophils: 52 %
PLATELETS: 233 10*3/uL (ref 150–379)
RBC: 4.44 x10E6/uL (ref 3.77–5.28)
RDW: 13 % (ref 12.3–15.4)
WBC: 7.9 10*3/uL (ref 3.4–10.8)

## 2015-10-25 LAB — URINALYSIS, ROUTINE W REFLEX MICROSCOPIC
BILIRUBIN UA: NEGATIVE
Glucose, UA: NEGATIVE
Ketones, UA: NEGATIVE
LEUKOCYTES UA: NEGATIVE
Nitrite, UA: NEGATIVE
PH UA: 6.5 (ref 5.0–7.5)
Protein, UA: NEGATIVE
RBC UA: NEGATIVE
Specific Gravity, UA: 1.014 (ref 1.005–1.030)
Urobilinogen, Ur: 0.2 mg/dL (ref 0.2–1.0)

## 2015-10-25 LAB — CULTURE, OB URINE

## 2015-10-25 LAB — GC/CHLAMYDIA PROBE AMP
CHLAMYDIA, DNA PROBE: NEGATIVE
Neisseria gonorrhoeae by PCR: NEGATIVE

## 2015-10-25 LAB — ABO

## 2015-10-25 LAB — ANTIBODY SCREEN: ANTIBODY SCREEN: NEGATIVE

## 2015-10-25 LAB — RPR: RPR Ser Ql: NONREACTIVE

## 2015-10-25 LAB — NICOTINE SCREEN, URINE: COTININE UR QL SCN: NEGATIVE ng/mL

## 2015-10-25 LAB — RH TYPE: RH TYPE: NEGATIVE

## 2015-10-25 LAB — RUBELLA SCREEN: Rubella Antibodies, IGG: 1.28 index (ref >0.99)

## 2015-10-25 LAB — URINE CULTURE, OB REFLEX

## 2015-10-25 LAB — HIV ANTIBODY (ROUTINE TESTING W REFLEX): HIV SCREEN 4TH GENERATION: NONREACTIVE

## 2015-10-25 LAB — HEPATITIS B SURFACE ANTIGEN: HEP B S AG: NEGATIVE

## 2015-11-06 ENCOUNTER — Other Ambulatory Visit: Payer: BC Managed Care – PPO

## 2015-11-14 ENCOUNTER — Encounter: Payer: Self-pay | Admitting: Obstetrics and Gynecology

## 2015-11-16 ENCOUNTER — Ambulatory Visit (INDEPENDENT_AMBULATORY_CARE_PROVIDER_SITE_OTHER): Payer: BC Managed Care – PPO | Admitting: Obstetrics and Gynecology

## 2015-11-16 ENCOUNTER — Encounter: Payer: BC Managed Care – PPO | Admitting: Obstetrics and Gynecology

## 2015-11-16 ENCOUNTER — Telehealth: Payer: Self-pay | Admitting: Obstetrics and Gynecology

## 2015-11-16 VITALS — BP 127/66 | HR 76 | Wt 138.6 lb

## 2015-11-16 DIAGNOSIS — B009 Herpesviral infection, unspecified: Secondary | ICD-10-CM

## 2015-11-16 DIAGNOSIS — Z87728 Personal history of other specified (corrected) congenital malformations of nervous system and sense organs: Secondary | ICD-10-CM

## 2015-11-16 DIAGNOSIS — O09899 Supervision of other high risk pregnancies, unspecified trimester: Secondary | ICD-10-CM

## 2015-11-16 DIAGNOSIS — Z6791 Unspecified blood type, Rh negative: Secondary | ICD-10-CM

## 2015-11-16 DIAGNOSIS — O26899 Other specified pregnancy related conditions, unspecified trimester: Secondary | ICD-10-CM

## 2015-11-16 DIAGNOSIS — Z8759 Personal history of other complications of pregnancy, childbirth and the puerperium: Secondary | ICD-10-CM

## 2015-11-16 DIAGNOSIS — O09522 Supervision of elderly multigravida, second trimester: Secondary | ICD-10-CM

## 2015-11-16 DIAGNOSIS — O34219 Maternal care for unspecified type scar from previous cesarean delivery: Secondary | ICD-10-CM

## 2015-11-16 DIAGNOSIS — Z8709 Personal history of other diseases of the respiratory system: Secondary | ICD-10-CM

## 2015-11-16 DIAGNOSIS — O09292 Supervision of pregnancy with other poor reproductive or obstetric history, second trimester: Secondary | ICD-10-CM

## 2015-11-16 LAB — POCT URINALYSIS DIPSTICK
Bilirubin, UA: NEGATIVE
GLUCOSE UA: NEGATIVE
Ketones, UA: NEGATIVE
LEUKOCYTES UA: NEGATIVE
NITRITE UA: NEGATIVE
Protein, UA: NEGATIVE
RBC UA: NEGATIVE
Spec Grav, UA: 1.01
UROBILINOGEN UA: NEGATIVE
pH, UA: 7.5

## 2015-11-16 MED ORDER — HYDROXYZINE HCL 50 MG PO TABS: 50.0000 mg | ORAL_TABLET | Freq: Every day | ORAL | 3 refills | Status: DC

## 2015-11-16 NOTE — Progress Notes (Addendum)
OBSTETRIC INITIAL PRENATAL VISIT  Subjective:    Sydney Lynn is being seen today for her first obstetrical visit.  This is a planned pregnancy. She is a G2P1001 female at 4339w3d gestation, Estimated Date of Delivery: 05/27/16 with Patient's last menstrual period was 08/21/2015 (exact date), consistent with 9 week sono. Her obstetrical history is significant for advanced maternal age, intrauterine growth restriction (IUGR) and prior C-section x 1 and mild pre-eclampsia at term. Relationship with FOB: spouse, living together. Patient does intend to breast feed. Pregnancy history fully reviewed.    Obstetric History   G2   P1   T1   P0   A0   L1    SAB0   TAB0   Ectopic0   Multiple0   Live Births1     # Outcome Date GA Lbr Len/2nd Weight Sex Delivery Anes PTL Lv  2 Current           1 Term 01/27/15 2583w0d  4 lb 12.9 oz (2.18 kg) M CS-LTranv Spinal  LIV     Name: Kirshner,BOY Chariti     Complications: Fetal Intolerance,IUGR (intrauterine growth restriction) affecting care of mother, third trimester, fetus 1,Mild pre-eclampsia in third trimester     Apgar1:  3                Apgar5: 6      Gynecologic History:  Last pap smear was 2015.  Results were norml.  Denies h/o abnormal pap smears in the past.  Denies history of STIs.  Contraception: None   Past Medical History:  Diagnosis Date  . Asthma    Exercise Induced  . H/O spina bifida    Type 1  . HSV-2 infection   . 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 Hypertension Mother   . Spina bifida Father     mild  . Spina bifida Brother     mild    Past Surgical History:  Procedure Laterality Date  . CESAREAN SECTION N/A 01/27/2015   Procedure: CESAREAN SECTION;  Surgeon: Hildred LaserAnika Crista Nuon, MD;  Location: [facility] ORS;  Service: Obstetrics;  Laterality: N/A;  . NASAL SINUS SURGERY      Social History   Social History  . Marital status: Single    Spouse  name: N/A  . Number of children: N/A  . Years of education: N/A   Occupational History  . Not on file.   Social History Main Topics  . Smoking status: Never Smoker  . Smokeless tobacco: Never Used  . Alcohol use No  . Drug use: No  . Sexual activity: Yes    Birth control/ protection: None   Other Topics Concern  . Not on file   Social History Narrative  . No narrative on file    Current Outpatient Prescriptions on File Prior to Visit  Medication Sig Dispense Refill  . cetirizine (ZYRTEC) 5 MG tablet Take 5 mg by mouth daily.    Marland Kitchen. doxylamine, Sleep, (UNISOM) 25 MG tablet Take 1 tablet (25 mg total) by mouth at bedtime as needed for sleep (and nausea and vomiting). 30 tablet 2  . EPINEPHrine (EPIPEN 2-PAK) 0.3 mg/0.3 mL IJ SOAJ injection Inject 0.3 mLs (0.3 mg total) into the muscle once. 1 Device 3  . folic acid (FOLVITE) 1 MG tablet Take 1 tablet (1 mg total) by mouth daily. 30 tablet 10  . prenatal vitamin w/FE,  FA (PRENATAL 1 + 1) 27-1 MG TABS tablet Take 1 tablet by mouth daily at 12 noon.    . pyridOXINE (VITAMIN B-6) 50 MG tablet Take 1 tablet (50 mg total) by mouth 3 (three) times daily as needed (nausea). 90 tablet 2  . triamcinolone (NASACORT) 55 MCG/ACT nasal inhaler Place 2 sprays into both nostrils daily.     . valACYclovir (VALTREX) 500 MG tablet Take 500 mg by mouth 2 (two) times daily.     No current facility-administered medications on file prior to visit.      Allergies  Allergen Reactions  . Amoxicillin Hives  . Bee Venom      Review of Systems General:Not Present- Fever, Weight Loss and Weight Gain. Skin:Not Present- Rash. HEENT:Not Present- Blurred Vision, Headache and Bleeding Gums. Respiratory:Not Present- Difficulty Breathing. Breast:Not Present- Breast Mass. Cardiovascular:Not Present- Chest Pain, Elevated Blood Pressure, Fainting / Blacking Out and Shortness of Breath. Gastrointestinal:Not Present- Abdominal Pain, Constipation, Nausea  and Vomiting. Female Genitourinary:Not Present- Frequency, Painful Urination, Pelvic Pain, Vaginal Bleeding, Vaginal Discharge, Contractions, regular, Fetal Movements Decreased, Urinary Complaints and Vaginal Fluid. Musculoskeletal:Not Present- Back Pain and Leg Cramps. Neurological:Present - sleep disturbances (insomnia, Benadryl and Unisom not helping). Not Present- Dizziness. Psychiatric:Not Present- Depression.     Objective:   Blood pressure 127/66, pulse 76, weight 138 lb 9.6 oz (62.9 kg), last menstrual period 08/21/2015, not currently breastfeeding.  Body mass index is 23.79 kg/m.   General Appearance:    Alert, cooperative, no distress, appears stated age  Head:    Normocephalic, without obvious abnormality, atraumatic  Eyes:    PERRL, conjunctiva/corneas clear, EOM's intact, both eyes  Ears:    Normal external ear canals, both ears  Nose:   Nares normal, septum midline, mucosa normal, no drainage or sinus tenderness  Throat:   Lips, mucosa, and tongue normal; teeth and gums normal  Neck:   Supple, symmetrical, trachea midline, no adenopathy; thyroid: no enlargement/tenderness/nodules; no carotid bruit or JVD  Back:     Symmetric, no curvature, ROM normal, no CVA tenderness  Lungs:     Clear to auscultation bilaterally, respirations unlabored  Chest Wall:    No tenderness or deformity   Heart:    Regular rate and rhythm, S1 and S2 normal, no murmur, rub or gallop  Breast Exam:    No tenderness, masses, or nipple abnormality  Abdomen:     Soft, non-tender, bowel sounds active all four quadrants, no masses, no organomegaly. Well healed Pfannenstiel incision.  FH 12.  FHT 162 bpm.  Genitalia:    Pelvic:external genitalia normal, vagina without lesions, discharge, or tenderness, rectovaginal septum  normal. Cervix normal in appearance, no cervical motion tenderness, no adnexal masses or tenderness.  Pregnancy positive findings: uterine enlargement: 12 wk size, nontender.   Rectal:     Normal external sphincter.  No hemorrhoids appreciated. Internal exam not done.   Extremities:   Extremities normal, atraumatic, no cyanosis or edema  Pulses:   2+ and symmetric all extremities  Skin:   Skin color, texture, turgor normal, no rashes or lesions  Lymph nodes:   Cervical, supraclavicular, and axillary nodes normal  Neurologic:   CNII-XII intact, normal strength, sensation and reflexes throughout      Assessment:   Pregnancy at 12 and 3/7 weeks   Advanced maternal age H/o prior C-section x 1 H/o IUGR in prior pregnancy H/o mild pre-eclampsia in prior pregnancy Rh negative status H/o spina bifida (mild) H/o HSV-II H/o exercise induced asthma  Plan:   1. Pregnancy at 12 and 3/7 weeks  - Initial labs reviewed. - Prenatal vitamins encouraged. - Problem list reviewed and updated. - New OB counseling:  The patient has been given an overview regarding routine prenatal care.  Recommendations regarding diet, weight gain, and exercise in pregnancy were given. - Benefits of Breast Feeding were discussed. The patient is encouraged to consider nursing her baby post partum. - Pap smear up to date.   2. Advanced maternal age - Prenatal testing, optional genetic testing, and ultrasound use in pregnancy were reviewed. Panorama performed, results discussed today (low risk female).   3. H/o prior C-section x 1 - Discussion had with patient regarding TOLAC vs repeat C-section. Patient notes she would desire repeat C-section at time of delivery.   4. H/o IUGR in prior pregnancy - will closely monitor growth.  Will need growth scan at 32 weeks.   5. H/o mild pre-eclampsia in prior pregnancy - Patient at risk for development of pre-eclampsia again in future pregnancies. Will need to begin daily baby aspirin starting at 16 weeks.   6. Rh negative status - Will need Rhogam at [redacted] weeks gestation  7. H/o spina bifida (mild) - Currently taking folic acid  - Will need msAFP at next  visit.   8. H/o HSV-II - Will need HSV prophylaxis at 36 weeks.  Denies h/o recent or frequent outbreaks.   9. H/o exercise induced asthma - Albuterol inhaler as needed.    Follow up in 4 weeks.  50% of 30 min visit spent on counseling and coordination of care.     Hildred LaserAnika Sharnita Bogucki, MD [facility]

## 2015-11-16 NOTE — Telephone Encounter (Signed)
PT CALLED AND ER SCHOOL NURSE JUST TOLD HER THAT THEY HAVE A CONFIRM CASE OF FIFTH DISEASE.  PT THINKS THAT SHE GOT A COUPLE OF YEARS AGO SHE BROKE OUT IN A RASH ON ARMS, LEGS AND ABDOMEN AREA. SHE SAID THAT MOST KIDS GET ON THERE CHEEKS SO THE NURSE SAID THAT IT CAN CAUSE PROBLEMS DURING PREGNANCY. BUT SHE THINKS BECAUSE SHE MIGHT HAVE ALREADY GOTTEN IT A COUPLE OF YEARS AGO THAT MAYBE SHE IS IMMUNE TO IT BUT SHE WASN'T SURE IF THAT'S HOW THE DISEASE WORKS OR NOT SHE WOULD LIKE A CALL BACK PN WHAT SHE NEEDS TO DO.

## 2015-11-16 NOTE — Telephone Encounter (Signed)
Please inform patient that usually once you have been exposed to or had Fifth's disease, you do build up immunity.  However the only way to confirm that the rash she had was actually due to Fifth's disease is to test for antibodies.  If she was previously exposed, she will have antibodies and should not have to worry about getting it again. She can come in any day to have these done.  However, just to be safe, she should practice good hand hygiene, and let us know if she develops any flu-like symptoms or rash in the next few weeks.

## 2015-11-17 NOTE — Telephone Encounter (Signed)
Pt aware.

## 2015-11-20 ENCOUNTER — Encounter: Payer: Self-pay | Admitting: Obstetrics and Gynecology

## 2015-11-20 DIAGNOSIS — Z8759 Personal history of other complications of pregnancy, childbirth and the puerperium: Secondary | ICD-10-CM | POA: Insufficient documentation

## 2015-11-20 DIAGNOSIS — O34219 Maternal care for unspecified type scar from previous cesarean delivery: Secondary | ICD-10-CM | POA: Insufficient documentation

## 2015-11-22 ENCOUNTER — Encounter: Payer: Self-pay | Admitting: Obstetrics and Gynecology

## 2015-12-13 ENCOUNTER — Ambulatory Visit (INDEPENDENT_AMBULATORY_CARE_PROVIDER_SITE_OTHER): Payer: BC Managed Care – PPO | Admitting: Obstetrics and Gynecology

## 2015-12-13 VITALS — BP 130/73 | HR 79 | Wt 145.1 lb

## 2015-12-13 DIAGNOSIS — O09522 Supervision of elderly multigravida, second trimester: Secondary | ICD-10-CM

## 2015-12-13 DIAGNOSIS — O0992 Supervision of high risk pregnancy, unspecified, second trimester: Secondary | ICD-10-CM | POA: Diagnosis not present

## 2015-12-13 DIAGNOSIS — O09292 Supervision of pregnancy with other poor reproductive or obstetric history, second trimester: Secondary | ICD-10-CM

## 2015-12-13 DIAGNOSIS — Z20828 Contact with and (suspected) exposure to other viral communicable diseases: Secondary | ICD-10-CM

## 2015-12-13 DIAGNOSIS — Z8759 Personal history of other complications of pregnancy, childbirth and the puerperium: Secondary | ICD-10-CM

## 2015-12-13 DIAGNOSIS — Z23 Encounter for immunization: Secondary | ICD-10-CM | POA: Diagnosis not present

## 2015-12-13 LAB — POCT URINALYSIS DIPSTICK
BILIRUBIN UA: NEGATIVE
Blood, UA: NEGATIVE
GLUCOSE UA: NEGATIVE
KETONES UA: NEGATIVE
LEUKOCYTES UA: NEGATIVE
NITRITE UA: NEGATIVE
Protein, UA: NEGATIVE
Spec Grav, UA: 1.01
Urobilinogen, UA: NEGATIVE
pH, UA: 7

## 2015-12-13 MED ORDER — ASPIRIN EC 81 MG PO TBEC: 81.0000 mg | DELAYED_RELEASE_TABLET | Freq: Every day | ORAL | 2 refills | Status: DC

## 2015-12-13 NOTE — Progress Notes (Signed)
ROB: Notes possible exposure to parvovirus (2 kids in class had it). Will check Parvovirus labs. For msAFP today for h/o neural tube defects.  For flu vaccine today. To begin daily baby aspirin for h/o pre-eclampsia and IUGR in prior pregnancy. RTC in 4 weeks, for anatomy scan next visit.

## 2015-12-14 LAB — PARVOVIRUS B19 ANTIBODY, IGG AND IGM
Parvovirus B19 IgG: 3.6 index — ABNORMAL HIGH (ref 0.0–0.8)
Parvovirus B19 IgM: 0.2 index (ref 0.0–0.8)

## 2016-01-12 ENCOUNTER — Other Ambulatory Visit: Payer: Self-pay | Admitting: Obstetrics and Gynecology

## 2016-01-12 DIAGNOSIS — Z3689 Encounter for other specified antenatal screening: Secondary | ICD-10-CM

## 2016-01-29 ENCOUNTER — Ambulatory Visit (HOSPITAL_BASED_OUTPATIENT_CLINIC_OR_DEPARTMENT_OTHER)
Admission: RE | Admit: 2016-01-29 | Discharge: 2016-01-29 | Disposition: A | Discharge status: Home / Self Care | Payer: BC Managed Care – PPO | Source: Ambulatory Visit | Attending: Obstetrics and Gynecology | Admitting: Obstetrics and Gynecology

## 2016-01-29 ENCOUNTER — Ambulatory Visit
Admission: RE | Admit: 2016-01-29 | Discharge: 2016-01-29 | Disposition: A | Discharge status: Home / Self Care | Payer: BC Managed Care – PPO | Source: Ambulatory Visit | Attending: Obstetrics and Gynecology | Admitting: Obstetrics and Gynecology

## 2016-01-29 VITALS — BP 132/67 | Temp 97.5°F | Resp 17 | Ht 64.0 in | Wt 152.0 lb

## 2016-01-29 DIAGNOSIS — Z87728 Personal history of other specified (corrected) congenital malformations of nervous system and sense organs: Secondary | ICD-10-CM

## 2016-01-29 DIAGNOSIS — O09892 Supervision of other high risk pregnancies, second trimester: Secondary | ICD-10-CM | POA: Insufficient documentation

## 2016-01-29 DIAGNOSIS — O09522 Supervision of elderly multigravida, second trimester: Secondary | ICD-10-CM | POA: Insufficient documentation

## 2016-01-29 DIAGNOSIS — Z87898 Personal history of other specified conditions: Secondary | ICD-10-CM | POA: Diagnosis not present

## 2016-01-29 DIAGNOSIS — O09529 Supervision of elderly multigravida, unspecified trimester: Secondary | ICD-10-CM

## 2016-01-29 DIAGNOSIS — Z3689 Encounter for other specified antenatal screening: Secondary | ICD-10-CM | POA: Insufficient documentation

## 2016-01-29 DIAGNOSIS — O34219 Maternal care for unspecified type scar from previous cesarean delivery: Secondary | ICD-10-CM | POA: Insufficient documentation

## 2016-01-29 DIAGNOSIS — Z3A24 24 weeks gestation of pregnancy: Secondary | ICD-10-CM | POA: Insufficient documentation

## 2016-01-29 DIAGNOSIS — Q057 Lumbar spina bifida without hydrocephalus: Secondary | ICD-10-CM | POA: Insufficient documentation

## 2016-01-29 NOTE — Progress Notes (Signed)
[facility] Consultation   Chief Complaint: History of "spina bifida", preeclampsia and FGR in prior pregnancy  HPI: Sydney Lynn is a 41 y.o. G2P1001 at 102w0d by LMP=[redacted]w[redacted]d [place] who presents in consultation from [facility] for recommendations regarding patient's personal history of spina bifida, preeclampsia and FGR.  Patient states that both she, her brother and her father all have "spina bifida" which was diagnosed on a spine x-ray after evaluation of back pain.  She has no skin lesion, bladder or bowel abnormalities or ambulation abnormalities.  Query if this is a true diagnosis of spina bifida - x-ray was done at a chiropractor's office and is not available in EPIC or CareEverywhere.  Of note, she had an uncomplicated spinal placed for her c-section in January 2017.  She was diagnosed with gestational hypertension and subsequently mild preeclampsia based on a a p/c ratio around 36 weeks with her first pregnancy.  Around that same time, she had an [place] for size<dates and was noted to have low amniotic fluid and FGR.  She was scheduled for IOL at 37 weeks but due to FHR decelerations, ended up with a c-section.  Baby boy weighed 4#13oz and stayed 10 days in the NICU for hypoglycemia.  Currently is at expected weight for age.  Past Medical History: Patient  has a past medical history of Asthma; H/O spina bifida; and HSV-2 infection.  Past Surgical History: She  has a past surgical history that includes Nasal sinus surgery and Cesarean section (N/A, 01/27/2015).  Obstetric History:  OB History    Gravida Para Term Preterm AB Living   2 1 1     1    SAB TAB Ectopic Multiple Live Births         0 1    01/27/2015 primary LTCS of live female, 4#13oz, 37 weeks (preeclampsia and FGR)  Gynecologic History:  Patient's last menstrual period was 08/21/2015 (exact date).   Denies hx of abnormal PAPs Does have a history of HSV - takes daily Valrex  Medications:  Albuterol Nebulizer  PRN 81mg  ASA Zyrtec Unisom qhs Folic acid PNVs Nasacort Valtrex 500mg  daily Allergies: Patient is allergic to amoxicillin and bee venom.  Social History: Patient  reports that she has never smoked. She has never used smokeless tobacco. She reports that she does not drink alcohol or use drugs. She is not married but in a committed relationship with the father of the baby who is also the father of her son.  She works as an Insurance underwriter; he works with adults with disabilities Family History: family history includes Hypertension in her mother; Spina bifida in her brother and father. PGM had a stroke.  Mom had uterine cancer.  MGM and maternal uncle died of a brain tumor. Review of Systems A full 12 point review of systems was negative or as noted in the History of Present Illness.  Physical Exam: BP 132/67   Temp 97.5 F (36.4 C)   Resp 17   Ht 5\' 4"  (1.626 m)   Wt 152 lb (68.9 kg)   LMP 08/21/2015 (Exact Date)   SpO2 100%   BMI 26.09 kg/m    Asessement: 40yo G2P1001 at [redacted]w[redacted]d with history of spina bifida (with no deficits), preeclampsia late preterm and FGR.  Plan: 1.  AMA - had negative cell free fetal DNA screening and negative msAFP.  Ultrasound for fetal anatomy today. 2.  History of spina bifida - had negative msAFP - will have fetal US  screening today.  Query if the diagnosis is correct or if she has a tethered cord.  Spinal anesthesia was uneventful.  She is aware that mild or closed defects may be missed on ultrasound and to inform her pediatrician. 3.  Close interval pregnancy - monitor with monthly US for growth after 28 weeks 4.  History of preeclampsia - mildly elevated BP today; she reports being normotensive between pregnancies.  Never required magnesium sulfate.  Currently taking a baby ASA.  Baseline preX labs wnls (p/c 172) 5.  History of FGR - query if this was related to poor placentation (which may have explained the low amniotic fluid) or to her  elevated BPs.  Recommend monthly US after 28 weeks with increased antenatal surveillance should the EFW fall <10th percentile. 6.  HSV - on daily valtrex suppression  Total time spent with the patient was 30 minutes with greater than 50% spent in counseling and coordination of care. We appreciate this interesting consult and will be happy to be involved in the ongoing care of Ms. Sydney Lynn in anyway her obstetricians desire.  Kirby FunkSarah Morad Tal, MD [facility]Fetal [facility]

## 2016-04-01 ENCOUNTER — Observation Stay
Admission: EM | Admit: 2016-04-01 | Discharge: 2016-04-01 | Disposition: A | Discharge status: Home / Self Care | Payer: BC Managed Care – PPO | Attending: Obstetrics and Gynecology | Admitting: Obstetrics and Gynecology

## 2016-04-01 ENCOUNTER — Encounter: Payer: Self-pay | Admitting: *Deleted

## 2016-04-01 DIAGNOSIS — Z3483 Encounter for supervision of other normal pregnancy, third trimester: Secondary | ICD-10-CM | POA: Diagnosis not present

## 2016-04-01 DIAGNOSIS — Z3689 Encounter for other specified antenatal screening: Secondary | ICD-10-CM

## 2016-04-01 NOTE — Discharge Instructions (Signed)
Drink plenty of fluid and get plenty of rest. Call your provider for any other concerns °

## 2016-04-01 NOTE — Progress Notes (Signed)
Patient ID: Sydney Lynn, female   DOB: 10/07/1975, 41 y.o.   MRN: [id number]  Sydney Lynn 07/16/1975 G2 P1 7280w0d presents for nst for h/o IUGR , AMA noLOF , no vaginal bleeding , O;BP 129/84   Pulse 67   LMP 08/21/2015   NSTreactive Labs: none A: reassuring fetal monitoring  P:d/c home

## 2016-04-01 NOTE — OB Triage Note (Signed)
Scheduled NST 

## 2016-04-01 NOTE — Discharge Summary (Signed)
  Suzy Bouchardhomas J Milta Croson, MD  Obstetrics    [] Hide copied text [] Hover for attribution information Patient ID: Sydney Lynn, female   DOB: 11/10/1975, 41 y.o.   MRN: [id number]  Sydney BigJill Gurry 08/08/1975 G2 P1 4622w0d presents for nst for h/o IUGR , AMA noLOF , no vaginal bleeding , O;BP 129/84   Pulse 67   LMP 08/21/2015   NSTreactive Labs: none A: reassuring fetal monitoring  P:d/c home      Electronically signed by Suzy Bouchardhomas J Derryl Uher, MD at 04/01/2016 7:22 PM

## 2016-04-19 ENCOUNTER — Observation Stay
Admission: EM | Admit: 2016-04-19 | Discharge: 2016-04-19 | Disposition: A | Discharge status: Home / Self Care | Payer: BC Managed Care – PPO | Attending: Obstetrics and Gynecology | Admitting: Obstetrics and Gynecology

## 2016-04-19 DIAGNOSIS — J45909 Unspecified asthma, uncomplicated: Secondary | ICD-10-CM | POA: Diagnosis not present

## 2016-04-19 DIAGNOSIS — Z7982 Long term (current) use of aspirin: Secondary | ICD-10-CM | POA: Diagnosis not present

## 2016-04-19 DIAGNOSIS — Z91030 Bee allergy status: Secondary | ICD-10-CM | POA: Diagnosis not present

## 2016-04-19 DIAGNOSIS — Z8249 Family history of ischemic heart disease and other diseases of the circulatory system: Secondary | ICD-10-CM | POA: Insufficient documentation

## 2016-04-19 DIAGNOSIS — Z3A34 34 weeks gestation of pregnancy: Secondary | ICD-10-CM | POA: Diagnosis not present

## 2016-04-19 DIAGNOSIS — Z820 Family history of epilepsy and other diseases of the nervous system: Secondary | ICD-10-CM | POA: Insufficient documentation

## 2016-04-19 DIAGNOSIS — O1210 Gestational proteinuria, unspecified trimester: Secondary | ICD-10-CM | POA: Diagnosis present

## 2016-04-19 DIAGNOSIS — Z880 Allergy status to penicillin: Secondary | ICD-10-CM | POA: Diagnosis not present

## 2016-04-19 DIAGNOSIS — O99513 Diseases of the respiratory system complicating pregnancy, third trimester: Secondary | ICD-10-CM | POA: Diagnosis not present

## 2016-04-19 DIAGNOSIS — Z8619 Personal history of other infectious and parasitic diseases: Secondary | ICD-10-CM | POA: Insufficient documentation

## 2016-04-19 DIAGNOSIS — Z79899 Other long term (current) drug therapy: Secondary | ICD-10-CM | POA: Insufficient documentation

## 2016-04-19 DIAGNOSIS — Q059 Spina bifida, unspecified: Secondary | ICD-10-CM | POA: Insufficient documentation

## 2016-04-19 LAB — PROTEIN / CREATININE RATIO, URINE
Creatinine, Urine: 16 mg/dL
Total Protein, Urine: <6 mg/dL

## 2016-04-19 LAB — COMPREHENSIVE METABOLIC PANEL
ALBUMIN: 2.8 g/dL — AB (ref 3.5–5.0)
ALT: 22 U/L (ref 14–54)
AST: 28 U/L (ref 15–41)
Alkaline Phosphatase: 120 U/L (ref 38–126)
Anion gap: 5 (ref 5–15)
BILIRUBIN TOTAL: 0.5 mg/dL (ref 0.3–1.2)
BUN: 14 mg/dL (ref 6–20)
CALCIUM: 8.9 mg/dL (ref 8.9–10.3)
CO2: 22 mmol/L (ref 22–32)
Chloride: 108 mmol/L (ref 101–111)
Creatinine, Ser: 0.68 mg/dL (ref 0.44–1.00)
GFR calc Af Amer: >60 mL/min (ref >60)
GFR calc non Af Amer: >60 mL/min (ref >60)
GLUCOSE: 97 mg/dL (ref 65–99)
POTASSIUM: 3.9 mmol/L (ref 3.5–5.1)
Sodium: 135 mmol/L (ref 135–145)
TOTAL PROTEIN: 6.4 g/dL — AB (ref 6.5–8.1)

## 2016-04-19 LAB — CBC
HEMATOCRIT: 37.8 % (ref 35.0–47.0)
HEMOGLOBIN: 13.2 g/dL (ref 12.0–16.0)
MCH: 34.8 pg — ABNORMAL HIGH (ref 26.0–34.0)
MCHC: 35 g/dL (ref 32.0–36.0)
MCV: 99.4 fL (ref 80.0–100.0)
Platelets: 150 10*3/uL (ref 150–440)
RBC: 3.8 MIL/uL (ref 3.80–5.20)
RDW: 13.2 % (ref 11.5–14.5)
WBC: 10.4 10*3/uL (ref 3.6–11.0)

## 2016-04-19 NOTE — Discharge Summary (Signed)
Sydney Bouchard, MD  Obstetrics    Hide copied text Hover for attribution information Patient ID: Sydney Lynn, female   DOB: 1975-10-28, 41 y.o.   MRN: [id number]  Sydney Lynn is a 41 y.o. female. She is at [redacted]w[redacted]d gestation. Patient's last menstrual period was 08/21/2015. Estimated Date of Delivery: 05/27/16  Prenatal care site: [facility]  S: sent from clinic with Urine P/C ratio that was 6600 . Prior h/o preeclampsia    No Vision change , no h/a +   Active fetal movement.      Maternal Medical History:       Past Medical History:  Diagnosis Date  . Asthma    Exercise Induced  . H/O spina bifida    Type 1  . HSV-2 infection          Past Surgical History:  Procedure Laterality Date  . CESAREAN SECTION N/A 01/27/2015   Procedure: CESAREAN SECTION;  Surgeon: Hildred Laser, MD;  Location: [facility] ORS;  Service: Obstetrics;  Laterality: N/A;  . NASAL SINUS SURGERY          Allergies  Allergen Reactions  . Amoxicillin Hives  . Bee Venom            Prior to Admission medications   Medication Sig Start Date End Date Taking? Authorizing Provider  aspirin EC 81 MG tablet Take 1 tablet (81 mg total) by mouth daily. Take after 12 weeks for prevention of preeclampssia later in pregnancy 12/13/15  Yes Hildred Laser, MD  cetirizine (ZYRTEC) 5 MG tablet Take 5 mg by mouth daily.   Yes Historical Provider, MD  doxylamine, Sleep, (UNISOM) 25 MG tablet Take 1 tablet (25 mg total) by mouth at bedtime as needed for sleep (and nausea and vomiting). 10/10/15  Yes Hildred Laser, MD  folic acid (FOLVITE) 1 MG tablet Take 1 tablet (1 mg total) by mouth daily. 10/10/15  Yes Hildred Laser, MD  methyldopa (ALDOMET) 250 MG tablet Take 250 mg by mouth 2 (two) times daily.   Yes Historical Provider, MD  prenatal vitamin w/FE, FA (PRENATAL 1 + 1) 27-1 MG TABS tablet Take 1 tablet by mouth daily at 12 noon.   Yes Historical Provider, MD  triamcinolone  (NASACORT) 55 MCG/ACT nasal inhaler Place 2 sprays into both nostrils daily.  09/12/11  Yes Historical Provider, MD  valACYclovir (VALTREX) 500 MG tablet Take 500 mg by mouth 2 (two) times daily.   Yes Historical Provider, MD  EPINEPHrine (EPIPEN 2-PAK) 0.3 mg/0.3 mL IJ SOAJ injection Inject 0.3 mLs (0.3 mg total) into the muscle once. 08/02/14   Hildred Laser, MD  hydrOXYzine (ATARAX/VISTARIL) 50 MG tablet Take 1 tablet (50 mg total) by mouth at bedtime. Patient not taking: Reported on 01/29/2016 11/16/15   Hildred Laser, MD  pyridOXINE (VITAMIN B-6) 50 MG tablet Take 1 tablet (50 mg total) by mouth 3 (three) times daily as needed (nausea). Patient not taking: Reported on 01/29/2016 10/10/15   Hildred Laser, MD     Social History: She  reports that she has never smoked. She has never used smokeless tobacco. She reports that she does not drink alcohol or use drugs.  Family History: family history includes Hypertension in her mother; Spina bifida in her brother and father.  OB : + prior h/o Preeclampsia Review of Systems: A full review of systems was performed and negative except as noted in the HPI.     O:  BP 131/82   Pulse 78   Temp  98.4 F (36.9 C) (Oral)   Resp 16   Ht  (1.626 m)   Wt 77.1 kg (170 lb)   LMP 08/21/2015   BMI 29.18 kg/m  No results found for this or any previous visit (from the past 48 hour(s)).  All SBP < 140 , ALL DBP < 90  Constitutional: NAD, AAOx3  HE/ENT: extraocular movements grossly intact, moist mucous membranes CV: RRR PULM: nl respiratory effort, CTABL                                         Abd: gravid, non-tender, non-distended, soft                                                  Ext: Non-tender, Nonedmeatous                     Psych: mood appropriate, speech normal Pelvic:not checked   FHT: reactive NST  TOCO: quiet  UA , cmp and cbc pending  A/P:  Normal BP , No evidence of preeclampsia based on BP  Criteria .      Fetal  Wellbeing: Reassuring Cat 1 tracing.  D/c home stable, precautions reviewed, follow-up as scheduled.   ----- Ihor Austin Schermerhorn MD Attending Obstetrician and Gynecologist [facility], Department of OB/GYN [facility]     Electronically signed by Sydney Bouchard, MD at 04/19/2016

## 2016-04-19 NOTE — Discharge Instructions (Signed)
Come back if:  Big gush of fluids Heavy vaginal bleeding Temp over 100.4 Decreased fetal movement Contractions every 3-5 min lasting at least one hour  Get plenty of rest and stay well hydrated! NST @ 2:00pm on Sunday 4/15.

## 2016-04-19 NOTE — Progress Notes (Signed)
Patient ID: Sydney Lynn, female   DOB: 11/22/1975, 41 y.o.   MRN: [id number] Spontaneous 2 minute decel after the pt was ready for d/c  Prolonged monitoring for 4 hrs shows no additional decels  +++ accels  And reactive nst . BP remain nl  Labs normal without significant  Proteinuria  D/C home and continue fetal kick counts . RTC in 48 hrs for repeat fetal NST

## 2016-04-19 NOTE — Progress Notes (Signed)
Patient ID: Lexia Vandevender, female   DOB: [birth year], 41 y.o.   MRN: [id number]  Kristien Salatino is a 41 y.o. female. She is at [redacted]w[redacted]d gestation. Patient's last menstrual period was 08/21/2015. Estimated Date of Delivery: 05/27/16  Prenatal care site: [facility]  S: sent from clinic with Urine P/C ratio that was 6600 . Prior h/o preeclampsia    No Vision change , no h/a +   Active fetal movement.      Maternal Medical History:   Past Medical History:  Diagnosis Date  . Asthma    Exercise Induced  . H/O spina bifida    Type 1  . HSV-2 infection     Past Surgical History:  Procedure Laterality Date  . CESAREAN SECTION N/A 01/27/2015   Procedure: CESAREAN SECTION;  Surgeon: Hildred Laser, MD;  Location: [facility] ORS;  Service: Obstetrics;  Laterality: N/A;  . NASAL SINUS SURGERY      Allergies  Allergen Reactions  . Amoxicillin Hives  . Bee Venom     Prior to Admission medications   Medication Sig Start Date End Date Taking? Authorizing Provider  aspirin EC 81 MG tablet Take 1 tablet (81 mg total) by mouth daily. Take after 12 weeks for prevention of preeclampssia later in pregnancy 12/13/15  Yes Hildred Laser, MD  cetirizine (ZYRTEC) 5 MG tablet Take 5 mg by mouth daily.   Yes Historical Provider, MD  doxylamine, Sleep, (UNISOM) 25 MG tablet Take 1 tablet (25 mg total) by mouth at bedtime as needed for sleep (and nausea and vomiting). 10/10/15  Yes Hildred Laser, MD  folic acid (FOLVITE) 1 MG tablet Take 1 tablet (1 mg total) by mouth daily. 10/10/15  Yes Hildred Laser, MD  methyldopa (ALDOMET) 250 MG tablet Take 250 mg by mouth 2 (two) times daily.   Yes Historical Provider, MD  prenatal vitamin w/FE, FA (PRENATAL 1 + 1) 27-1 MG TABS tablet Take 1 tablet by mouth daily at 12 noon.   Yes Historical Provider, MD  triamcinolone (NASACORT) 55 MCG/ACT nasal inhaler Place 2 sprays into both nostrils daily.  09/12/11  Yes Historical Provider, MD  valACYclovir (VALTREX) 500 MG tablet Take 500 mg  by mouth 2 (two) times daily.   Yes Historical Provider, MD  EPINEPHrine (EPIPEN 2-PAK) 0.3 mg/0.3 mL IJ SOAJ injection Inject 0.3 mLs (0.3 mg total) into the muscle once. 08/02/14   Hildred Laser, MD  hydrOXYzine (ATARAX/VISTARIL) 50 MG tablet Take 1 tablet (50 mg total) by mouth at bedtime. Patient not taking: Reported on 01/29/2016 11/16/15   Hildred Laser, MD  pyridOXINE (VITAMIN B-6) 50 MG tablet Take 1 tablet (50 mg total) by mouth 3 (three) times daily as needed (nausea). Patient not taking: Reported on 01/29/2016 10/10/15   Hildred Laser, MD     Social History: She  reports that she has never smoked. She has never used smokeless tobacco. She reports that she does not drink alcohol or use drugs.  Family History: family history includes Hypertension in her mother; Spina bifida in her brother and father.  OB : + prior h/o Preeclampsia Review of Systems: A full review of systems was performed and negative except as noted in the HPI.     O:  BP 131/82   Pulse 78   Temp 98.4 F (36.9 C) (Oral)   Resp 16   Ht  (1.626 m)   Wt 77.1 kg (170 lb)   LMP 08/21/2015   BMI 29.18 kg/m  No results found  for this or any previous visit (from the past 48 hour(s)).  All SBP < 140 , ALL DBP < 90  Constitutional: NAD, AAOx3  HE/ENT: extraocular movements grossly intact, moist mucous membranes CV: RRR PULM: nl respiratory effort, CTABL     Abd: gravid, non-tender, non-distended, soft      Ext: Non-tender, Nonedmeatous   Psych: mood appropriate, speech normal Pelvic:not checked   FHT: reactive NST  TOCO: quiet  UA , cmp and cbc pending  A/P:  Normal BP , No evidence of preeclampsia based on BP  Criteria .      Fetal Wellbeing: Reassuring Cat 1 tracing.  D/c home stable, precautions reviewed, follow-up as scheduled.   ----- Ihor Austin Schermerhorn MD Attending Obstetrician and Gynecologist [facility], Department of OB/GYN [facility]

## 2016-04-19 NOTE — OB Triage Note (Signed)
Pt presents to [place] from office with elevated blood pressures for pre-eclampsia work-up. Denies any pain, NVD, bleeding, or LOF.

## 2016-04-21 ENCOUNTER — Inpatient Hospital Stay: Payer: BC Managed Care – PPO | Admitting: Anesthesiology

## 2016-04-21 ENCOUNTER — Encounter
Admission: RE | Disposition: A | Discharge status: Home / Self Care | Payer: Self-pay | Source: Ambulatory Visit | Attending: Obstetrics and Gynecology

## 2016-04-21 ENCOUNTER — Inpatient Hospital Stay
Admission: RE | Admit: 2016-04-21 | Discharge: 2016-04-24 | DRG: 765 | Disposition: A | Discharge status: Home / Self Care | Payer: BC Managed Care – PPO | Source: Ambulatory Visit | Attending: Obstetrics and Gynecology | Admitting: Obstetrics and Gynecology

## 2016-04-21 DIAGNOSIS — Z880 Allergy status to penicillin: Secondary | ICD-10-CM

## 2016-04-21 DIAGNOSIS — Z3A34 34 weeks gestation of pregnancy: Secondary | ICD-10-CM | POA: Diagnosis not present

## 2016-04-21 DIAGNOSIS — O1214 Gestational proteinuria, complicating childbirth: Secondary | ICD-10-CM | POA: Diagnosis present

## 2016-04-21 DIAGNOSIS — A6000 Herpesviral infection of urogenital system, unspecified: Secondary | ICD-10-CM | POA: Diagnosis present

## 2016-04-21 DIAGNOSIS — O26893 Other specified pregnancy related conditions, third trimester: Secondary | ICD-10-CM | POA: Diagnosis present

## 2016-04-21 DIAGNOSIS — Z6791 Unspecified blood type, Rh negative: Secondary | ICD-10-CM | POA: Diagnosis not present

## 2016-04-21 DIAGNOSIS — O9081 Anemia of the puerperium: Secondary | ICD-10-CM | POA: Diagnosis not present

## 2016-04-21 DIAGNOSIS — O368390 Maternal care for abnormalities of the fetal heart rate or rhythm, unspecified trimester, not applicable or unspecified: Secondary | ICD-10-CM | POA: Diagnosis present

## 2016-04-21 DIAGNOSIS — Z9889 Other specified postprocedural states: Secondary | ICD-10-CM

## 2016-04-21 DIAGNOSIS — O9832 Other infections with a predominantly sexual mode of transmission complicating childbirth: Secondary | ICD-10-CM | POA: Diagnosis present

## 2016-04-21 DIAGNOSIS — O288 Other abnormal findings on antenatal screening of mother: Secondary | ICD-10-CM

## 2016-04-21 DIAGNOSIS — O34211 Maternal care for low transverse scar from previous cesarean delivery: Secondary | ICD-10-CM | POA: Diagnosis present

## 2016-04-21 LAB — CBC
HEMATOCRIT: 39.3 % (ref 35.0–47.0)
HEMOGLOBIN: 13.5 g/dL (ref 12.0–16.0)
MCH: 34.2 pg — AB (ref 26.0–34.0)
MCHC: 34.4 g/dL (ref 32.0–36.0)
MCV: 99.5 fL (ref 80.0–100.0)
PLATELETS: 157 10*3/uL (ref 150–440)
RBC: 3.95 MIL/uL (ref 3.80–5.20)
RDW: 13.2 % (ref 11.5–14.5)
WBC: 11.1 10*3/uL — ABNORMAL HIGH (ref 3.6–11.0)

## 2016-04-21 LAB — PROTEIN / CREATININE RATIO, URINE
CREATININE, URINE: 55 mg/dL
Protein Creatinine Ratio: 0.25 mg/mg{Cre} — ABNORMAL HIGH (ref 0.00–0.15)
Total Protein, Urine: 14 mg/dL

## 2016-04-21 LAB — COMPREHENSIVE METABOLIC PANEL
ALBUMIN: 2.9 g/dL — AB (ref 3.5–5.0)
ALT: 26 U/L (ref 14–54)
ANION GAP: 7 (ref 5–15)
AST: 36 U/L (ref 15–41)
Alkaline Phosphatase: 133 U/L — ABNORMAL HIGH (ref 38–126)
BILIRUBIN TOTAL: 0.5 mg/dL (ref 0.3–1.2)
BUN: 13 mg/dL (ref 6–20)
CHLORIDE: 107 mmol/L (ref 101–111)
CO2: 20 mmol/L — ABNORMAL LOW (ref 22–32)
CREATININE: 0.59 mg/dL (ref 0.44–1.00)
Calcium: 8.8 mg/dL — ABNORMAL LOW (ref 8.9–10.3)
GFR calc Af Amer: >60 mL/min (ref >60)
GFR calc non Af Amer: >60 mL/min (ref >60)
GLUCOSE: 71 mg/dL (ref 65–99)
POTASSIUM: 3.9 mmol/L (ref 3.5–5.1)
Sodium: 134 mmol/L — ABNORMAL LOW (ref 135–145)
TOTAL PROTEIN: 6.6 g/dL (ref 6.5–8.1)

## 2016-04-21 SURGERY — Surgical Case
Anesthesia: Spinal

## 2016-04-21 MED ORDER — FENTANYL CITRATE (PF) 100 MCG/2ML IJ SOLN
INTRAMUSCULAR | Status: DC | PRN
  Administered 2016-04-21: 15 ug via INTRATHECAL

## 2016-04-21 MED ORDER — DIPHENHYDRAMINE HCL 50 MG/ML IJ SOLN: 12.5000 mg | INTRAMUSCULAR | Status: DC | PRN

## 2016-04-21 MED ORDER — OXYTOCIN 40 UNITS IN LACTATED RINGERS INFUSION - SIMPLE MED
INTRAVENOUS | Status: AC
  Filled 2016-04-21: qty 1000

## 2016-04-21 MED ORDER — IBUPROFEN 600 MG PO TABS
600.0000 mg | ORAL_TABLET | Freq: Four times a day (QID) | ORAL | Status: DC
  Administered 2016-04-22 – 2016-04-23 (×3): 600 mg via ORAL
  Filled 2016-04-21 (×3): qty 1

## 2016-04-21 MED ORDER — EPHEDRINE SULFATE 50 MG/ML IJ SOLN
INTRAMUSCULAR | Status: DC | PRN
  Administered 2016-04-21 (×2): 15 mg via INTRAVENOUS

## 2016-04-21 MED ORDER — ACETAMINOPHEN 325 MG PO TABS
650.0000 mg | ORAL_TABLET | ORAL | Status: DC | PRN
  Administered 2016-04-22 – 2016-04-24 (×5): 650 mg via ORAL
  Filled 2016-04-21 (×5): qty 2

## 2016-04-21 MED ORDER — ACETAMINOPHEN 325 MG PO TABS
650.0000 mg | ORAL_TABLET | Freq: Four times a day (QID) | ORAL | Status: DC
  Administered 2016-04-21: 650 mg via ORAL

## 2016-04-21 MED ORDER — NALBUPHINE HCL 10 MG/ML IJ SOLN
5.0000 mg | Freq: Once | INTRAMUSCULAR | Status: AC
Start: 2016-04-21 — End: 2016-04-21
  Administered 2016-04-21: 5 mg via INTRAVENOUS

## 2016-04-21 MED ORDER — KETOROLAC TROMETHAMINE 30 MG/ML IJ SOLN
INTRAMUSCULAR | Status: AC
  Filled 2016-04-21: qty 1

## 2016-04-21 MED ORDER — SIMETHICONE 80 MG PO CHEW
80.0000 mg | CHEWABLE_TABLET | Freq: Three times a day (TID) | ORAL | Status: DC
  Administered 2016-04-22 – 2016-04-24 (×6): 80 mg via ORAL
  Filled 2016-04-21 (×6): qty 1

## 2016-04-21 MED ORDER — SODIUM CHLORIDE 0.9 % IJ SOLN
INTRAMUSCULAR | Status: AC
  Filled 2016-04-21: qty 50

## 2016-04-21 MED ORDER — ACETAMINOPHEN 325 MG PO TABS: 650.0000 mg | ORAL_TABLET | Freq: Once | ORAL | Status: AC

## 2016-04-21 MED ORDER — SOD CITRATE-CITRIC ACID 500-334 MG/5ML PO SOLN
ORAL | Status: AC
  Administered 2016-04-21: 19:00:00
  Filled 2016-04-21: qty 15

## 2016-04-21 MED ORDER — DIPHENHYDRAMINE HCL 25 MG PO CAPS
25.0000 mg | ORAL_CAPSULE | ORAL | Status: DC | PRN
  Filled 2016-04-21: qty 1

## 2016-04-21 MED ORDER — OXYCODONE HCL 5 MG PO TABS: 10.0000 mg | ORAL_TABLET | ORAL | Status: DC | PRN

## 2016-04-21 MED ORDER — PRENATAL MULTIVITAMIN CH
1.0000 | ORAL_TABLET | Freq: Every day | ORAL | Status: DC
  Administered 2016-04-22 – 2016-04-24 (×3): 1 via ORAL
  Filled 2016-04-21 (×3): qty 1

## 2016-04-21 MED ORDER — COCONUT OIL OIL: 1.0000 "application " | TOPICAL_OIL | Status: DC | PRN

## 2016-04-21 MED ORDER — ACETAMINOPHEN 325 MG PO TABS
ORAL_TABLET | ORAL | Status: AC
  Administered 2016-04-21: 650 mg via ORAL
  Filled 2016-04-21: qty 2

## 2016-04-21 MED ORDER — MEPERIDINE HCL 25 MG/ML IJ SOLN: 6.2500 mg | INTRAMUSCULAR | Status: DC | PRN

## 2016-04-21 MED ORDER — KETOROLAC TROMETHAMINE 30 MG/ML IJ SOLN: 30.0000 mg | Freq: Once | INTRAMUSCULAR | Status: AC

## 2016-04-21 MED ORDER — WITCH HAZEL-GLYCERIN EX PADS: 1.0000 "application " | MEDICATED_PAD | CUTANEOUS | Status: DC | PRN

## 2016-04-21 MED ORDER — MORPHINE SULFATE (PF) 0.5 MG/ML IJ SOLN
INTRAMUSCULAR | Status: AC
  Filled 2016-04-21: qty 10

## 2016-04-21 MED ORDER — DIBUCAINE 1 % RE OINT: 1.0000 "application " | TOPICAL_OINTMENT | RECTAL | Status: DC | PRN

## 2016-04-21 MED ORDER — LABETALOL HCL 5 MG/ML IV SOLN
10.0000 mg | Freq: Once | INTRAVENOUS | Status: AC
  Administered 2016-04-21: 10 mg via INTRAVENOUS

## 2016-04-21 MED ORDER — LABETALOL HCL 5 MG/ML IV SOLN
INTRAVENOUS | Status: AC
  Administered 2016-04-21: 10 mg via INTRAVENOUS
  Filled 2016-04-21: qty 4

## 2016-04-21 MED ORDER — BUPIVACAINE LIPOSOME 1.3 % IJ SUSP
INTRAMUSCULAR | Status: AC
  Filled 2016-04-21: qty 20

## 2016-04-21 MED ORDER — SCOPOLAMINE 1 MG/3DAYS TD PT72: 1.0000 | MEDICATED_PATCH | Freq: Once | TRANSDERMAL | Status: DC

## 2016-04-21 MED ORDER — ONDANSETRON HCL 4 MG/2ML IJ SOLN
INTRAMUSCULAR | Status: DC | PRN
  Administered 2016-04-21: 4 mg via INTRAVENOUS

## 2016-04-21 MED ORDER — PHENYLEPHRINE HCL 10 MG/ML IJ SOLN
INTRAMUSCULAR | Status: DC | PRN
  Administered 2016-04-21: 100 ug via INTRAVENOUS

## 2016-04-21 MED ORDER — SODIUM CHLORIDE 0.9 % IV SOLN
INTRAVENOUS | Status: DC | PRN
  Administered 2016-04-21: 20:00:00

## 2016-04-21 MED ORDER — OXYTOCIN 40 UNITS IN LACTATED RINGERS INFUSION - SIMPLE MED
2.5000 [IU]/h | INTRAVENOUS | Status: AC
  Filled 2016-04-21: qty 1000

## 2016-04-21 MED ORDER — LACTATED RINGERS IV SOLN
INTRAVENOUS | Status: DC | PRN
  Administered 2016-04-21: 19:00:00 via INTRAVENOUS

## 2016-04-21 MED ORDER — ACETAMINOPHEN 325 MG PO TABS: 650.0000 mg | ORAL_TABLET | ORAL | Status: DC | PRN

## 2016-04-21 MED ORDER — NALBUPHINE HCL 10 MG/ML IJ SOLN
INTRAMUSCULAR | Status: AC
  Filled 2016-04-21: qty 1

## 2016-04-21 MED ORDER — BUPIVACAINE HCL (PF) 0.5 % IJ SOLN
INTRAMUSCULAR | Status: DC | PRN
  Administered 2016-04-21: 30 mL

## 2016-04-21 MED ORDER — KETOROLAC TROMETHAMINE 30 MG/ML IJ SOLN
30.0000 mg | Freq: Four times a day (QID) | INTRAMUSCULAR | Status: DC
  Administered 2016-04-21: 30 mg via INTRAVENOUS
  Filled 2016-04-21: qty 1

## 2016-04-21 MED ORDER — TETANUS-DIPHTH-ACELL PERTUSSIS 5-2.5-18.5 LF-MCG/0.5 IM SUSP: 0.5000 mL | Freq: Once | INTRAMUSCULAR | Status: DC

## 2016-04-21 MED ORDER — LIDOCAINE HCL (PF) 1 % IJ SOLN: 30.0000 mL | INTRAMUSCULAR | Status: DC | PRN

## 2016-04-21 MED ORDER — NALOXONE HCL 0.4 MG/ML IJ SOLN
0.4000 mg | INTRAMUSCULAR | Status: DC | PRN
  Filled 2016-04-21: qty 1

## 2016-04-21 MED ORDER — NALBUPHINE HCL 10 MG/ML IJ SOLN
5.0000 mg | INTRAMUSCULAR | Status: DC | PRN
  Filled 2016-04-21: qty 0.5

## 2016-04-21 MED ORDER — KETOROLAC TROMETHAMINE 30 MG/ML IJ SOLN
30.0000 mg | Freq: Four times a day (QID) | INTRAMUSCULAR | Status: DC
  Filled 2016-04-21: qty 1

## 2016-04-21 MED ORDER — KETOROLAC TROMETHAMINE 30 MG/ML IJ SOLN: 30.0000 mg | Freq: Four times a day (QID) | INTRAMUSCULAR | Status: DC

## 2016-04-21 MED ORDER — BUPIVACAINE IN DEXTROSE 0.75-8.25 % IT SOLN
INTRATHECAL | Status: DC | PRN
  Administered 2016-04-21: 1.8 mL via INTRATHECAL

## 2016-04-21 MED ORDER — SIMETHICONE 80 MG PO CHEW
80.0000 mg | CHEWABLE_TABLET | ORAL | Status: DC
  Administered 2016-04-22 – 2016-04-24 (×2): 80 mg via ORAL
  Filled 2016-04-21: qty 1

## 2016-04-21 MED ORDER — NALBUPHINE HCL 10 MG/ML IJ SOLN: 5.0000 mg | Freq: Once | INTRAMUSCULAR | Status: AC | PRN

## 2016-04-21 MED ORDER — LACTATED RINGERS IV SOLN: INTRAVENOUS | Status: DC

## 2016-04-21 MED ORDER — OXYTOCIN 40 UNITS IN LACTATED RINGERS INFUSION - SIMPLE MED
1.0000 m[IU]/min | INTRAVENOUS | Status: DC
  Administered 2016-04-21: 1000 mL via INTRAVENOUS
  Filled 2016-04-21 (×2): qty 1000

## 2016-04-21 MED ORDER — OXYCODONE HCL 5 MG PO TABS
5.0000 mg | ORAL_TABLET | ORAL | Status: DC | PRN
  Administered 2016-04-24 (×2): 5 mg via ORAL

## 2016-04-21 MED ORDER — ONDANSETRON HCL 4 MG/2ML IJ SOLN: 4.0000 mg | Freq: Four times a day (QID) | INTRAMUSCULAR | Status: DC | PRN

## 2016-04-21 MED ORDER — MORPHINE SULFATE (PF) 0.5 MG/ML IJ SOLN
INTRAMUSCULAR | Status: DC | PRN
  Administered 2016-04-21: .1 mg via INTRATHECAL

## 2016-04-21 MED ORDER — LABETALOL HCL 5 MG/ML IV SOLN: 20.0000 mg | INTRAVENOUS | Status: DC | PRN

## 2016-04-21 MED ORDER — ZOLPIDEM TARTRATE 5 MG PO TABS
5.0000 mg | ORAL_TABLET | Freq: Every evening | ORAL | Status: DC | PRN
  Administered 2016-04-23: 5 mg via ORAL
  Filled 2016-04-21: qty 1

## 2016-04-21 MED ORDER — MENTHOL 3 MG MT LOZG
1.0000 | LOZENGE | OROMUCOSAL | Status: DC | PRN
  Filled 2016-04-21: qty 9

## 2016-04-21 MED ORDER — GLYCOPYRROLATE 0.2 MG/ML IJ SOLN
INTRAMUSCULAR | Status: DC | PRN
  Administered 2016-04-21: 0.2 mg via INTRAVENOUS

## 2016-04-21 MED ORDER — DIPHENHYDRAMINE HCL 25 MG PO CAPS
25.0000 mg | ORAL_CAPSULE | Freq: Four times a day (QID) | ORAL | Status: DC | PRN
  Administered 2016-04-23: 25 mg via ORAL

## 2016-04-21 MED ORDER — ONDANSETRON HCL 4 MG/2ML IJ SOLN
INTRAMUSCULAR | Status: AC
  Filled 2016-04-21: qty 2

## 2016-04-21 MED ORDER — SODIUM CHLORIDE 0.9% FLUSH: 3.0000 mL | INTRAVENOUS | Status: DC | PRN

## 2016-04-21 MED ORDER — ONDANSETRON HCL 4 MG/2ML IJ SOLN: 4.0000 mg | Freq: Three times a day (TID) | INTRAMUSCULAR | Status: DC | PRN

## 2016-04-21 MED ORDER — DEXTROSE 5 % IV SOLN
2.0000 g | Freq: Once | INTRAVENOUS | Status: AC
  Administered 2016-04-21: 2 g via INTRAVENOUS
  Filled 2016-04-21: qty 0

## 2016-04-21 MED ORDER — NALOXONE HCL 2 MG/2ML IJ SOSY
1.0000 ug/kg/h | PREFILLED_SYRINGE | INTRAVENOUS | Status: DC | PRN
  Filled 2016-04-21: qty 2

## 2016-04-21 MED ORDER — SENNOSIDES-DOCUSATE SODIUM 8.6-50 MG PO TABS
2.0000 | ORAL_TABLET | ORAL | Status: DC
  Administered 2016-04-22 – 2016-04-24 (×2): 2 via ORAL
  Filled 2016-04-21 (×2): qty 2

## 2016-04-21 MED ORDER — ACETAMINOPHEN 325 MG PO TABS
650.0000 mg | ORAL_TABLET | Freq: Four times a day (QID) | ORAL | Status: DC
  Administered 2016-04-22: 650 mg via ORAL
  Filled 2016-04-21: qty 2

## 2016-04-21 MED ORDER — LACTATED RINGERS IV SOLN: 500.0000 mL | INTRAVENOUS | Status: DC | PRN

## 2016-04-21 MED ORDER — SIMETHICONE 80 MG PO CHEW
80.0000 mg | CHEWABLE_TABLET | ORAL | Status: DC | PRN
  Administered 2016-04-23: 80 mg via ORAL
  Filled 2016-04-21 (×2): qty 1

## 2016-04-21 MED ORDER — FENTANYL CITRATE (PF) 100 MCG/2ML IJ SOLN
INTRAMUSCULAR | Status: AC
  Filled 2016-04-21: qty 2

## 2016-04-21 MED ORDER — NALBUPHINE HCL 10 MG/ML IJ SOLN
5.0000 mg | Freq: Once | INTRAMUSCULAR | Status: AC | PRN
  Administered 2016-04-21: 5 mg via INTRAVENOUS

## 2016-04-21 MED ORDER — KETOROLAC TROMETHAMINE 30 MG/ML IJ SOLN
30.0000 mg | Freq: Four times a day (QID) | INTRAMUSCULAR | Status: DC
  Administered 2016-04-22: 30 mg via INTRAVENOUS
  Filled 2016-04-21: qty 1

## 2016-04-21 MED ORDER — BUPIVACAINE HCL (PF) 0.5 % IJ SOLN
INTRAMUSCULAR | Status: AC
  Filled 2016-04-21: qty 30

## 2016-04-21 MED ORDER — NALBUPHINE HCL 10 MG/ML IJ SOLN
5.0000 mg | INTRAMUSCULAR | Status: DC | PRN
  Administered 2016-04-22 (×6): 5 mg via INTRAVENOUS
  Filled 2016-04-21 (×6): qty 1
  Filled 2016-04-21: qty 0.5
  Filled 2016-04-21: qty 1

## 2016-04-21 MED ORDER — BUPIVACAINE LIPOSOME 1.3 % IJ SUSP: 20.0000 mL | Freq: Once | INTRAMUSCULAR | Status: DC

## 2016-04-21 MED ORDER — SOD CITRATE-CITRIC ACID 500-334 MG/5ML PO SOLN: 30.0000 mL | ORAL | Status: DC | PRN

## 2016-04-21 MED ORDER — LABETALOL HCL 5 MG/ML IV SOLN
20.0000 mg | INTRAVENOUS | Status: DC | PRN
  Filled 2016-04-21: qty 4

## 2016-04-21 MED ORDER — OXYCODONE HCL 5 MG PO TABS
5.0000 mg | ORAL_TABLET | ORAL | Status: DC | PRN
  Filled 2016-04-21 (×2): qty 1

## 2016-04-21 SURGICAL SUPPLY — 25 items
BARRIER ADHS 3X4 INTERCEED (GAUZE/BANDAGES/DRESSINGS) ×3 IMPLANT
BRR ADH 4X3 ABS CNTRL BYND (GAUZE/BANDAGES/DRESSINGS) ×1
CANISTER SUCT 3000ML (MISCELLANEOUS) ×3 IMPLANT
CATH KIT ON-Q SILVERSOAK 5 (CATHETERS) ×2 IMPLANT
CATH KIT ON-Q SILVERSOAK 5IN (CATHETERS) ×6 IMPLANT
CHLORAPREP W/TINT 26ML (MISCELLANEOUS) ×3 IMPLANT
DRSG TELFA 3X8 NADH (GAUZE/BANDAGES/DRESSINGS) ×3 IMPLANT
ELECT CAUTERY BLADE 6.4 (BLADE) ×3 IMPLANT
ELECT REM PT RETURN 9FT ADLT (ELECTROSURGICAL) ×3
ELECTRODE REM PT RTRN 9FT ADLT (ELECTROSURGICAL) ×1 IMPLANT
GAUZE SPONGE 4X4 12PLY STRL (GAUZE/BANDAGES/DRESSINGS) ×3 IMPLANT
GLOVE BIO SURGEON STRL SZ8 (GLOVE) ×3 IMPLANT
GOWN STRL REUS W/ TWL LRG LVL3 (GOWN DISPOSABLE) ×2 IMPLANT
GOWN STRL REUS W/ TWL XL LVL3 (GOWN DISPOSABLE) ×1 IMPLANT
GOWN STRL REUS W/TWL LRG LVL3 (GOWN DISPOSABLE) ×6
GOWN STRL REUS W/TWL XL LVL3 (GOWN DISPOSABLE) ×3
NS IRRIG 1000ML POUR BTL (IV SOLUTION) ×3 IMPLANT
PACK C SECTION AR (MISCELLANEOUS) ×3 IMPLANT
PAD DRESSING TELFA 3X8 NADH (GAUZE/BANDAGES/DRESSINGS) ×1 IMPLANT
PAD OB MATERNITY 4.3X12.25 (PERSONAL CARE ITEMS) ×3 IMPLANT
PAD PREP 24X41 OB/GYN DISP (PERSONAL CARE ITEMS) ×3 IMPLANT
STRAP SAFETY BODY (MISCELLANEOUS) ×3 IMPLANT
SUT CHROMIC 1 CTX 36 (SUTURE) ×9 IMPLANT
SUT PLAIN GUT 0 (SUTURE) ×6 IMPLANT
SUT VIC AB 0 CT1 36 (SUTURE) ×6 IMPLANT

## 2016-04-21 SURGICAL SUPPLY — 25 items
BARRIER ADHS 3X4 INTERCEED (GAUZE/BANDAGES/DRESSINGS) ×3 IMPLANT
BRR ADH 4X3 ABS CNTRL BYND (GAUZE/BANDAGES/DRESSINGS) ×1
CANISTER SUCT 3000ML (MISCELLANEOUS) ×3 IMPLANT
CATH KIT ON-Q SILVERSOAK 5 (CATHETERS) ×2 IMPLANT
CATH KIT ON-Q SILVERSOAK 5IN (CATHETERS) IMPLANT
CHLORAPREP W/TINT 26ML (MISCELLANEOUS) ×5 IMPLANT
DRSG TELFA 3X8 NADH (GAUZE/BANDAGES/DRESSINGS) ×3 IMPLANT
ELECT CAUTERY BLADE 6.4 (BLADE) ×3 IMPLANT
ELECT REM PT RETURN 9FT ADLT (ELECTROSURGICAL) ×3
ELECTRODE REM PT RTRN 9FT ADLT (ELECTROSURGICAL) ×1 IMPLANT
GAUZE SPONGE 4X4 12PLY STRL (GAUZE/BANDAGES/DRESSINGS) ×3 IMPLANT
GLOVE BIO SURGEON STRL SZ8 (GLOVE) ×13 IMPLANT
GOWN STRL REUS W/ TWL LRG LVL3 (GOWN DISPOSABLE) ×2 IMPLANT
GOWN STRL REUS W/ TWL XL LVL3 (GOWN DISPOSABLE) ×1 IMPLANT
GOWN STRL REUS W/TWL LRG LVL3 (GOWN DISPOSABLE) ×9
GOWN STRL REUS W/TWL XL LVL3 (GOWN DISPOSABLE) ×3
NS IRRIG 1000ML POUR BTL (IV SOLUTION) ×3 IMPLANT
PACK C SECTION AR (MISCELLANEOUS) ×3 IMPLANT
PAD DRESSING TELFA 3X8 NADH (GAUZE/BANDAGES/DRESSINGS) ×1 IMPLANT
PAD OB MATERNITY 4.3X12.25 (PERSONAL CARE ITEMS) ×3 IMPLANT
PAD PREP 24X41 OB/GYN DISP (PERSONAL CARE ITEMS) ×3 IMPLANT
STRAP SAFETY BODY (MISCELLANEOUS) ×3 IMPLANT
SUT CHROMIC 1 CTX 36 (SUTURE) ×9 IMPLANT
SUT PLAIN GUT 0 (SUTURE) ×6 IMPLANT
SUT VIC AB 0 CT1 36 (SUTURE) ×6 IMPLANT

## 2016-04-21 NOTE — Op Note (Signed)
NAME:  Sydney Lynn, Sydney Lynn NO.:  [id number]  MEDICAL RECORD NO.:  [id number]  LOCATION:  OBS1                         FACILITY:  [facility]  PHYSICIAN:  Jennell Corner, MDDATE OF BIRTH:  Oct 29, 1975  DATE OF PROCEDURE:  04/21/2016 DATE OF DISCHARGE:                              OPERATIVE REPORT   PREOPERATIVE DIAGNOSIS: 1. A 34 plus 6 weeks estimated gestational age. 2. Nonreassuring fetal monitoring.  POSTOPERATIVE DIAGNOSIS: 1. A 34 plus 6 weeks estimated gestational age. 2. Nonreassuring fetal monitoring.  PROCEDURE PERFORMED:  Repeat low-transverse cesarean section.  SURGEON:  Jennell Corner, MD  ANESTHESIA:  Spinal.  SURGEON:  Jennell Corner, MD.  FIRST ASSISTANT:  Scrub tech.  INDICATION:  A 41 year old, gravida 2, para 1, patient at 69 plus 6 weeks estimated gestational age, was seen 2 days previous to the procedure.  Was noted to have a spontaneous deceleration with prolonged monitoring and no additional decelerations noted.  The patient returned today for followup nonstress test and fetus demonstrated several spontaneous decelerations and amniotic fluid index was noted to be 7.7 cm.  DESCRIPTION OF PROCEDURE:  After adequate spinal anesthesia, the patient was placed in dorsal supine position.  The patient's abdomen was prepped and draped in normal sterile fashion.  The patient did receive 2 g of IV Ancef.  Of note, the patient does have reaction to penicillin, but received Ancef at her prior cesarean section without complication.  Time- out was performed.  A Pfannenstiel incision was made 2 fingerbreadths above the symphysis pubis which was inferior to her prior cesarean section.  Sharp dissection was used to identify the fascia.  Fascia was opened in the midline and opened in a transverse fashion.  Superior aspect of the fascia was grasped with Kocher clamps and the recti muscles were dissected free.  Inferior aspect of fascia was  grasped with Kocher clamps and pyramidalis muscle was dissected free.  Entry into the peritoneal cavity was accomplished sharply.  The vesicouterine peritoneal fold was identified and opened and the bladder was reflected inferiorly.  A low-transverse uterine incision was made upon entry into the amniotic cavity.  Clear fluid resulted.  Incision was extended with blunt transverse traction and fetal head was delivered without difficulty.  Vigorous female was delivered and placed on the abdomen and dried.  After 60 seconds, the cord was clamped and infant was passed to nursery staff who assigned Apgar scores of 8 and 9, fetal weight 2380 g. Baby was born at 55 on April 21, 2016.  Placenta was delivered. Intravenous Pitocin was administered and the uterus was exteriorized. Endometrial cavity was wiped clean with laparotomy tape and the cervix was opened with a ring forceps and this was passed off the operative field.  Uterine incision was closed with 1 chromic suture in a running locking fashion with good approximation of edges.  Good hemostasis was noted.  Posterior cul-de-sac was irrigated and suctioned.  The fallopian tubes and ovaries appeared normal.  The uterus was placed back in the abdominal cavity and the paracolic gutters were wiped clean with laparotomy tape.  Uterine incision again appeared hemostatic.  Interceed was placed over the uterine incision in a T-shaped  fashion and there was a small bleeding site at the inferior aspect of the fascia close to the insertion of the recti muscle on the right.  This was controlled with 2 separate figure-of-eight 2-0 Vicryl sutures.  Good hemostasis noted. Fascia was then reapproximated and closed with 0 Vicryl suture in a running nonlocking fashion.  The fascia was then injected with 60 mL of 1.3% Exparel mixed with 0.5% Marcaine and normal saline.  Subcutaneous tissues were irrigated and bovied for hemostasis and the skin was reapproximated  with Insorb absorbable staples and the skin was then injected with the remaining 30 mL of Exparel, total 90 mL Exparel mixture used.  There were no complications.  The patient tolerated the procedure well and was taken to recovery room in good condition.  ESTIMATED BLOOD LOSS:  500 mL.  INTRAOPERATIVE FLUIDS:  1000 mL.  URINE OUTPUT:  300 mL.          ______________________________ Jennell Corner, MD     TS/MEDQ  D:  04/21/2016  T:  04/21/2016  Job:  [id number]

## 2016-04-21 NOTE — Progress Notes (Signed)
Patient ID: Sydney Lynn, female   DOB: November 23, 1975, 41 y.o.   MRN: [id number] Another spontaneous decel  To 90 " A: non reassuring fetal monitor . Proceed with urgent repeat LTCS . Pt declines BTL  Risks of the procedure discussed with the pt . All questions answered

## 2016-04-21 NOTE — Discharge Summary (Signed)
Obstetric Discharge Summary   Patient ID: Patient Name: Sydney Lynn DOB: 1975/09/02 MRN: [id number]  Date of Admission: 04/21/2016 Date of Discharge: 04/24/2016  Primary OB: [facility]   Gestational Age at Delivery: [redacted]w[redacted]d   Antepartum complications: non reassuring fetal monitoring  Admitting Diagnosis: recurrent spontaneous late decels   Secondary Diagnoses: Patient Active Problem List   Diagnosis Date Noted  . Maternal care for fetal decelerations during pregnancy 04/21/2016  . Postoperative state 04/21/2016  . Proteinuria affecting pregnancy 04/19/2016  . NST (non-stress test) reactive 04/01/2016  . Previous cesarean delivery affecting pregnancy 11/20/2015  . History of prior pregnancy with IUGR newborn 11/20/2015  . Advanced maternal age in multigravida 10/10/2015  . H/O intrinsic asthma 10/10/2015  . History of pre-eclampsia in prior pregnancy, currently pregnant in second trimester 01/26/2015  . 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 Rh negative state in antepartum period 10/25/2014  . H/O spina bifida 07/28/2014  . Low back pain associated with a spinal disorder other than radiculopathy or spinal stenosis 12/17/2013  . Lateral pain of left hip 09/26/2011  . PATELLO-FEMORAL SYNDROME 08/17/2008  . UNEQUAL LEG LENGTH 08/17/2008  . ABNORMALITY OF GAIT 08/17/2008    Augmentation: Complications: None Intrapartum complications/course: pt underwent a repeat LTCS on 04/21/16 Viable boy  APGARS 8/9  Date of Delivery: 04/21/16 Delivered By: Dr Feliberto Gottron Delivery Type: repeat cesarean section, low vertical incision Anesthesia: epidural Placenta: sponatneous Laceration:  Episiotomy: none  Newborn Data: Live born female  Birth Weight: 5 lb 4 oz (2380 g) APGAR: 8, 9 Baby blood A-     Postpartum Course  Patient had an uncomplicated postpartum course.  By time of discharge on PPD#3, her pain was controlled on oral pain medications; she had  appropriate lochia and was ambulating, voiding without difficulty and tolerating regular diet.  She was deemed stable for discharge to home.  Some uterine pain with breast pumping .    Labs: CBC Latest Ref Rng & Units 04/22/2016 04/21/2016 04/19/2016  WBC 3.6 - 11.0 K/uL 13.6(H) 11.1(H) 10.4  Hemoglobin 12.0 - 16.0 g/dL 40.9 81.1 91.4  Hematocrit 35.0 - 47.0 % 36.3 39.3 37.8  Platelets 150 - 440 K/uL 126(L) 157 150   A NEG  Physical exam:  BP 140/81 (BP Location: Left Arm)   Pulse 64   Temp 98.1 F (36.7 C) (Oral)   Resp 20   Ht  (1.626 m)   Wt 77.1 kg (170 lb)   LMP 08/21/2015   SpO2 96%   Breastfeeding? Unknown   BMI 29.18 kg/m  General: alert and no distress Pulm: normal respiratory effort Lochia: appropriate Abdomen: soft, NT Uterine Fundus: firm, below umbilicus Incision: c/d/i, healing well, no significant drainage, no dehiscence, no significant erythema Extremities: No evidence of DVT seen on physical exam. No lower extremity edema.   Disposition: stable, discharge to home Baby Feeding: pumping breast ,milK Baby Disposition: nursery Contraception: micronor  Prenatal Labs:     Plan:  Monifah Freehling was discharged to home in good condition. [facility] Inst[facility]: Per After Visit Summary. Activity: Advance as tolerated. Pelvic rest for 6 weeks.  Refer to After Visit Summary Diet: Regular Discharge Medications: Allergies as of 04/24/2016      Reactions   Amoxicillin Hives   Bee Venom       Medication List    STOP taking these medications   aspirin EC 81 MG tablet   doxylamine (Sleep) 25 MG tablet Commonly known  as:  UNISOM   folic acid 1 MG tablet Commonly known as:  FOLVITE   hydrOXYzine 50 MG tablet Commonly known as:  ATARAX/VISTARIL   methyldopa 250 MG tablet Commonly known as:  ALDOMET   pyridOXINE 50 MG tablet Commonly known as:  VITAMIN B-6     TAKE these medications   cetirizine 5 MG  tablet Commonly known as:  ZYRTEC Take 5 mg by mouth daily.   docusate sodium 100 MG capsule Commonly known as:  COLACE Take 1 capsule (100 mg total) by mouth 2 (two) times daily.   EPINEPHrine 0.3 mg/0.3 mL Soaj injection Commonly known as:  EPIPEN 2-PAK Inject 0.3 mLs (0.3 mg total) into the muscle once.   ibuprofen 600 MG tablet Commonly known as:  ADVIL,MOTRIN Take 1 tablet (600 mg total) by mouth every 6 (six) hours.   norethindrone 0.35 MG tablet Commonly known as:  ORTHO MICRONOR Take 1 tablet (0.35 mg total) by mouth daily.   oxyCODONE 5 MG immediate release tablet Commonly known as:  Oxy IR/ROXICODONE Take 1 tablet (5 mg total) by mouth every 4 (four) hours as needed for moderate pain.   prenatal vitamin w/FE, FA 27-1 MG Tabs tablet Take 1 tablet by mouth daily at 12 noon.   triamcinolone 55 MCG/ACT nasal inhaler Commonly known as:  NASACORT Place 2 sprays into both nostrils daily.   valACYclovir 500 MG tablet Commonly known as:  VALTREX Take 500 mg by mouth 2 (two) times daily.      Outpatient follow up:  Follow-up Information    Suzy Bouchard, MD Follow up in 2 week(s).   Specialty:  Obstetrics and Gynecology Why:  wound check Contact information: [address] [place] [address] [phone]            Signed:  Ihor Austin Makari Portman MD 04/24/2016

## 2016-04-21 NOTE — Transfer of Care (Signed)
Immediate Anesthesia Transfer of Care Note  Patient: Sydney Lynn  Procedure(s) Performed: Procedure(s): CESAREAN SECTION  Patient Location: PACU  Anesthesia Type:Spinal  Level of Consciousness: awake, alert  and oriented  Airway & Oxygen Therapy: Patient Spontanous Breathing  Post-op Assessment: Report given to RN and Post -op Vital signs reviewed and stable  Post vital signs: Reviewed and stable  Last Vitals:  Vitals:   04/21/16 1841 04/21/16 1958  BP: (!) 146/87 (!) 151/92  Pulse: 69 62  Resp:  12  Temp:  36.5 C    Last Pain:  Vitals:   04/21/16 1958  TempSrc: Temporal  PainSc: 0-No pain         Complications: No apparent anesthesia complications

## 2016-04-21 NOTE — Anesthesia Procedure Notes (Signed)
Spinal  Patient location during procedure: OR Start time: 04/21/2016 6:45 PM End time: 04/21/2016 7:00 PM Staffing Anesthesiologist: Randa Lynn AMY Performed: anesthesiologist  Preanesthetic Checklist Completed: patient identified, site marked, surgical consent, pre-op evaluation, timeout performed, IV checked, risks and benefits discussed and monitors and equipment checked Spinal Block Patient position: sitting Prep: ChloraPrep Patient monitoring: heart rate, continuous pulse ox, blood pressure and cardiac monitor Approach: midline Location: L4-5 Injection technique: single-shot Needle Needle type: Whitacre and Introducer  Needle gauge: 24 G Needle length: 9 cm Additional Notes Negative paresthesia. Negative blood return. Positive free-flowing CSF. Expiration date of kit checked and confirmed. Patient tolerated procedure well, without complications.

## 2016-04-21 NOTE — Brief Op Note (Signed)
04/21/2016  7:56 PM  PATIENT:  Sydney Lynn  41 y.o. female  PRE-OPERATIVE DIAGNOSIS:  previous c section, Non reassuring  fetal monitoring  POST-OPERATIVE DIAGNOSIS:  same  PROCEDURE:  Procedure(s): CESAREAN SECTION, repeat low transverse   SURGEON:  Surgeon(s) and Role:    Ihor Austin Keondrick Dilks, MD - Primary  PHYSICIAN ASSISTANT: scrub tech   ASSISTANTS: none   ANESTHESIA:   spinal  EBL:  Total I/O In: 650 [I.V.:650] Out: 1100 [Urine:600; Blood:500]  BLOOD ADMINISTERED:none  DRAINS: Urinary Catheter (Foley)   LOCAL MEDICATIONS USED:  OTHER 20 cc 1.3% exparel + 30 cc 0.5% marcaine+ 50 cc saline  mixture  SPECIMEN:  Source of Specimen:  placenta  DISPOSITION OF SPECIMEN:  PATHOLOGY  COUNTS:  YES  TOURNIQUET:  * No tourniquets in log *  DICTATION: .Other Dictation: Dictation Number verbal  PLAN OF CARE: Admit to inpatient   PATIENT DISPOSITION:  PACU - hemodynamically stable.   Delay start of Pharmacological VTE agent (>24hrs) due to surgical blood loss or risk of bleeding: not applicable

## 2016-04-21 NOTE — Anesthesia Post-op Follow-up Note (Cosign Needed)
Anesthesia QCDR form completed.        

## 2016-04-21 NOTE — OB Triage Note (Signed)
Patient here for NST, no complaints, baby has been moving well. Occasional headaches this weekend thinks r/t allergies.

## 2016-04-21 NOTE — H&P (Signed)
Sydney Lynn is a 41 y.o. female at 34+6 weeks based on LMP and [facility] 05/27/16 present for a follow up NSt for decel noted 2 days ago on NST . Pt states she has had good fetal movt  Today .  NSt show 2 separate spontaneous decels one coming after a ctx   pregnancy complicated by a prior c/s , prior preeclampsia and prior UGR and prior oligo, and AMA.. Pt's h/o spinal bifida is questioned by MFM  And she had an uneventful Spinal with last c/s  OB History    Gravida Para Term Preterm AB Living   SAB TAB Ectopic Multiple Live Births         0 1     Past Medical History:  Diagnosis Date  . Asthma    Exercise Induced  . H/O spina bifida    Type 1  . HSV-2 infection    Past Surgical History:  Procedure Laterality Date  . CESAREAN SECTION N/A 01/27/2015   Procedure: CESAREAN SECTION;  Surgeon: Hildred Laser, MD;  Location: [facility] ORS;  Service: Obstetrics;  Laterality: N/A;  . NASAL SINUS SURGERY     Family History: family history includes Hypertension in her mother; Spina bifida in her brother and father. Social History:  reports that she has never smoked. She has never used smokeless tobacco. She reports that she does not drink alcohol or use drugs.     Maternal Diabetes: No Genetic Screening: Normal Maternal Ultrasounds/Referrals: Normal Fetal Ultrasounds or other Referrals:  Other: AFI today on u/s 7.7 cm , Last frowth u/s on 04/04/16 = 2107 gm (30%) with afi= 12 cm  Maternal Substance Abuse:  No Significant Maternal Medications:  Meds include: Other: ASA Significant Maternal Lab Results:  None Other Comments:  None  ROS History   Blood pressure 134/85, pulse 72, temperature 97.9 F (36.6 C), temperature source Oral, last menstrual period 08/21/2015, not currently breastfeeding. Exam Physical Exam  Lungs CTA  CV RRR  Adb soft NT gravid  U/S by me : AFI 7.7 cm vtx and good fetal movt  Prenatal labs: ABO, Rh: A/Negative/-- (10/16 1541) Antibody: Negative (10/16  1541) Rubella: 1.28 (10/16 1541) RPR: Non Reactive (10/16 1541)  HBsAg: Negative (10/16 1541)  HIV: Non Reactive (10/16 1541)  GBS:   not done   Assessment/Plan: Spontaneous decel , possible late decel with low normal AFI  decel 2 days ago  CST today ,if + proceed with repeat LTCS    Izaiah Tabb 04/21/2016, 5:48 PM

## 2016-04-21 NOTE — Anesthesia Preprocedure Evaluation (Signed)
Anesthesia Evaluation  Patient identified by MRN, date of birth, ID band Patient awake    Reviewed: Allergy & Precautions, NPO status , Patient's Chart, lab work & pertinent test results  History of Anesthesia Complications Negative for: history of anesthetic complications  Airway Mallampati: I  TM Distance: >3 FB Neck ROM: Full    Dental no notable dental hx.    Pulmonary asthma , neg sleep apnea, neg COPD,    breath sounds clear to auscultation- rhonchi (-) wheezing      Cardiovascular Exercise Tolerance: Good hypertension (gHTN), (-) CAD and (-) Past MI  Rhythm:Regular Rate:Normal - Systolic murmurs and - Diastolic murmurs    Neuro/Psych negative neurological ROS  negative psych ROS   GI/Hepatic negative GI ROS, Neg liver ROS,   Endo/Other  negative endocrine ROSneg diabetes  Renal/GU negative Renal ROS     Musculoskeletal negative musculoskeletal ROS (+)   Abdominal Gravid abdomen  Peds  Hematology negative hematology ROS (+)   Anesthesia Other Findings   Reproductive/Obstetrics                             Anesthesia Physical Anesthesia Plan  ASA: II  Anesthesia Plan: Spinal   Post-op Pain Management:    Induction:   Airway Management Planned: Natural Airway  Additional Equipment:   Intra-op Plan:   Post-operative Plan:   Informed Consent: I have reviewed the patients History and Physical, chart, labs and discussed the procedure including the risks, benefits and alternatives for the proposed anesthesia with the patient or authorized representative who has indicated his/her understanding and acceptance.   Dental advisory given  Plan Discussed with: Anesthesiologist and CRNA  Anesthesia Plan Comments:         Lab Results  Component Value Date   WBC 10.4 04/19/2016   HGB 13.2 04/19/2016   HCT 37.8 04/19/2016   MCV 99.4 04/19/2016   PLT 150 04/19/2016     Anesthesia Quick Evaluation

## 2016-04-22 ENCOUNTER — Encounter: Payer: Self-pay | Admitting: Obstetrics and Gynecology

## 2016-04-22 LAB — CBC
HEMATOCRIT: 36.3 % (ref 35.0–47.0)
Hemoglobin: 12.7 g/dL (ref 12.0–16.0)
MCH: 34.6 pg — ABNORMAL HIGH (ref 26.0–34.0)
MCHC: 34.9 g/dL (ref 32.0–36.0)
MCV: 99.2 fL (ref 80.0–100.0)
Platelets: 126 10*3/uL — ABNORMAL LOW (ref 150–440)
RBC: 3.66 MIL/uL — ABNORMAL LOW (ref 3.80–5.20)
RDW: 12.9 % (ref 11.5–14.5)
WBC: 13.6 10*3/uL — AB (ref 3.6–11.0)

## 2016-04-22 MED ORDER — ACETAMINOPHEN 325 MG PO TABS
650.0000 mg | ORAL_TABLET | Freq: Four times a day (QID) | ORAL | Status: AC
  Administered 2016-04-22 (×3): 650 mg via ORAL
  Filled 2016-04-22 (×3): qty 2

## 2016-04-22 MED ORDER — KETOROLAC TROMETHAMINE 30 MG/ML IJ SOLN: 30.0000 mg | Freq: Four times a day (QID) | INTRAMUSCULAR | Status: AC

## 2016-04-22 MED ORDER — KETOROLAC TROMETHAMINE 30 MG/ML IJ SOLN
30.0000 mg | Freq: Four times a day (QID) | INTRAMUSCULAR | Status: AC
  Administered 2016-04-22: 30 mg via INTRAVENOUS
  Filled 2016-04-22: qty 1

## 2016-04-22 MED FILL — Cefazolin Sodium For Inj 1 GM: INTRAMUSCULAR | Qty: 10 | Status: AC

## 2016-04-22 NOTE — Progress Notes (Signed)
Subjective:   Doing well.  No complaints. Has not voided or ambulated yet, Foley in place.  She is tolerating regular PO diet, tolerating pain with PO meds. Denies: CP SOB F/C, N/V, calf pain    Objective:  Blood pressure 138/87, pulse (!) 56, temperature 97.6 F (36.4 C), temperature source Oral, resp. rate 18, height  (1.626 m), weight 77.1 kg (170 lb), last menstrual period 08/21/2015, SpO2 97 %, unknown if currently breastfeeding.  General: NAD Pulmonary: no increased work of breathing Abdomen: non-distended, non-tender, fundus firm at level of umbilicus Incision: bandaged, C/D/I Extremities: no edema, no erythema, no tenderness  Results for orders placed or performed during the hospital encounter of 04/21/16 (from the past 24 hour(s))  Comprehensive metabolic panel     Status: Abnormal   Collection Time: 04/21/16  6:20 PM  Result Value Ref Range   Sodium 134 (L) 135 - 145 mmol/L   Potassium 3.9 3.5 - 5.1 mmol/L   Chloride 107 101 - 111 mmol/L   CO2 20 (L) 22 - 32 mmol/L   Glucose, Bld 71 65 - 99 mg/dL   BUN 13 6 - 20 mg/dL   Creatinine, Ser 1.61 0.44 - 1.00 mg/dL   Calcium 8.8 (L) 8.9 - 10.3 mg/dL   Total Protein 6.6 6.5 - 8.1 g/dL   Albumin 2.9 (L) 3.5 - 5.0 g/dL   AST 36 15 - 41 U/L   ALT 26 14 - 54 U/L   Alkaline Phosphatase 133 (H) 38 - 126 U/L   Total Bilirubin 0.5 0.3 - 1.2 mg/dL   GFR calc non Af Amer >60 >60 mL/min   GFR calc Af Amer >60 >60 mL/min   Anion gap 7 5 - 15  CBC     Status: Abnormal   Collection Time: 04/21/16  6:26 PM  Result Value Ref Range   WBC 11.1 (H) 3.6 - 11.0 K/uL   RBC 3.95 3.80 - 5.20 MIL/uL   Hemoglobin 13.5 12.0 - 16.0 g/dL   HCT 09.6 04.5 - 40.9 %   MCV 99.5 80.0 - 100.0 fL   MCH 34.2 (H) 26.0 - 34.0 pg   MCHC 34.4 32.0 - 36.0 g/dL   RDW 81.1 91.4 - 78.2 %   Platelets 157 150 - 440 K/uL  Type and screen [facility]     Status: None (Preliminary result)   Collection Time: 04/21/16  6:26 PM  Result  Value Ref Range   ABO/RH(D) A NEG    Antibody Screen POS    Sample Expiration 04/24/2016    Antibody Identification PASSIVELY ACQUIRED ANTI-D    Unit Number [id number]    Blood Component Type RED CELLS,LR    Unit division 00    Status of Unit ALLOCATED    Transfusion Status OK TO TRANSFUSE    Crossmatch Result COMPATIBLE    Unit Number [id number]    Blood Component Type RED CELLS,LR    Unit division 00    Status of Unit ALLOCATED    Transfusion Status OK TO TRANSFUSE    Crossmatch Result COMPATIBLE   Protein / creatinine ratio, urine     Status: Abnormal   Collection Time: 04/21/16  9:04 PM  Result Value Ref Range   Creatinine, Urine 55 mg/dL   Total Protein, Urine 14 mg/dL   Protein Creatinine Ratio 0.25 (H) 0.00 - 0.15 mg/mg[Cre]  CBC     Status: Abnormal   Collection Time: 04/22/16  5:13 AM  Result Value Ref Range  WBC 13.6 (H) 3.6 - 11.0 K/uL   RBC 3.66 (L) 3.80 - 5.20 MIL/uL   Hemoglobin 12.7 12.0 - 16.0 g/dL   HCT 56.2 13.0 - 86.5 %   MCV 99.2 80.0 - 100.0 fL   MCH 34.6 (H) 26.0 - 34.0 pg   MCHC 34.9 32.0 - 36.0 g/dL   RDW 78.4 69.6 - 29.5 %   Platelets 126 (L) 150 - 440 K/uL    Intake/Output Summary (Last 24 hours) at 04/22/16 1227 Last data filed at 04/22/16 1035  Gross per 24 hour  Intake             1265 ml  Output             2095 ml  Net             -830 ml      Assessment:   41 y.o. [id number] postoperativeday # 1 from repeat LTCS   Plan:  1) Acute blood loss anemia - hemodynamically stable and asymptomatic - po ferrous sulfate   2) post op: remove foley  Get OOB.  3) routine post cesarean care  4) disposition: continue inpatient admission  ----- Ranae Plumber, MD Attending Obstetrician and Gynecologist [facility], Department of OB/GYN [facility]

## 2016-04-22 NOTE — Lactation Note (Signed)
Lactation Consultation Note  Patient Name: Sydney Lynn [id number] Date: 04/22/2016  Mom has baby in [place]. She has been using DEBP 4 times since baby was born. We discussed importance of pumping at least 8 times per 24 hours. She did need larger flanges so I gave her 27mm which worked much better. She has a white bleb on right nipple, a likely clogged pore. She applied warm soak for 10 minutes, massaged area and then pumped 15 minutes. (It could take at least a few days of this process to clear it). She also had two l"lick and learn" sessions in [facility] while skin to skin with [place]. He as quite sleepy so we just focused on proper position in cradle and then football hold.  Mom states she has [employer]a Purely Yours breast pump at home. I encouraged ehr to have it brought in to ensure all pieces were present and pump functioning well since she had used it before.    Maternal Data    Feeding    LATCH Score/Interventions                      Lactation Tools Discussed/Used     Consult Status      Sydney Lynn 04/22/2016, 4:03 PM

## 2016-04-23 ENCOUNTER — Encounter: Payer: Self-pay | Admitting: Obstetrics and Gynecology

## 2016-04-23 LAB — SURGICAL PATHOLOGY

## 2016-04-23 LAB — RPR: RPR Ser Ql: NONREACTIVE

## 2016-04-23 MED ORDER — IBUPROFEN 600 MG PO TABS
600.0000 mg | ORAL_TABLET | Freq: Four times a day (QID) | ORAL | Status: DC
  Administered 2016-04-23 – 2016-04-24 (×5): 600 mg via ORAL
  Filled 2016-04-23 (×6): qty 1

## 2016-04-23 MED ORDER — SILVER SULFADIAZINE 1 % EX CREA
TOPICAL_CREAM | Freq: Two times a day (BID) | CUTANEOUS | Status: DC
  Administered 2016-04-24 (×2): via TOPICAL
  Filled 2016-04-23: qty 85

## 2016-04-23 NOTE — Anesthesia Post-op Follow-up Note (Signed)
  Anesthesia Pain Follow-up Note  Patient: Sydney Lynn  Day #: 1  Date of Follow-up: 04/23/2016 Time: 7:25 AM  Last Vitals:  Vitals:   04/22/16 2357 04/23/16 0358  BP: 134/75 133/85  Pulse: 68 (!) 59  Resp: 17 19  Temp: 36.7 C 36.6 C    Level of Consciousness: alert  Pain: none   Side Effects:None  Catheter Site Exam:clean, dry     Plan: D/C from anesthesia care at surgeon's request  [facility]

## 2016-04-23 NOTE — Lactation Note (Signed)
This note was copied from a baby's chart. Lactation Consultation Note  Patient Name: Sydney Lynn [id number] Date: 04/23/2016     Maternal Data    Feeding Feeding Type: Formula Length of feed: 30 min  LATCH Score/Interventions                      Lactation Tools Discussed/Used     Consult Status  LC explained to mom "give and take" breastfeeding relationship to establish milk supply for "[place]". Encouraged mom to pump every 2/3hrs or at tleast 8x in 24hr period. Told mom to try pumping after lick n' learn/skin to skin or in front of baby in SCN. Set mom up with pump supplies at baby's bedside.     Burnadette Peter 04/23/2016, 11:57 AM

## 2016-04-23 NOTE — Progress Notes (Signed)
Subjective: Postpartum Day 2: Cesarean Delivery Patient reports tolerating PO. Pain controlled. Pumping well.    Objective: Vital signs in last 24 hours: Temp:  [97.8 F (36.6 C)-98.2 F (36.8 C)] 97.9 F (36.6 C) (04/17 1623) Pulse Rate:  [59-78] 78 (04/17 1623) Resp:  [16-19] 17 (04/17 1623) BP: (133-146)/(71-85) 139/71 (04/17 1623) SpO2:  [96 %-99 %] 97 % (04/17 1623)  Physical Exam:  General: alert Lochia: appropriate Uterine Fundus: firm Incision: healing well, no significant drainage, no dehiscence DVT Evaluation: No evidence of DVT seen on physical exam.   Recent Labs  04/21/16 1826 04/22/16 0513  HGB 13.5 12.7  HCT 39.3 36.3    Assessment/Plan: Status post Cesarean section. Doing well postoperatively.  Continue current care.  Christeen Douglas 04/23/2016, 5:32 PM

## 2016-04-23 NOTE — Anesthesia Postprocedure Evaluation (Signed)
Anesthesia Post Note  Patient: Sydney Lynn  Procedure(s) Performed: Procedure(s) (LRB): CESAREAN SECTION (N/A)  Patient location during evaluation: Mother Baby Anesthesia Type: Spinal Level of consciousness: awake, awake and alert and oriented Pain management: pain level controlled Vital Signs Assessment: post-procedure vital signs reviewed and stable Respiratory status: spontaneous breathing, nonlabored ventilation and respiratory function stable Cardiovascular status: blood pressure returned to baseline and stable Postop Assessment: no headache and no backache Anesthetic complications: no     Last Vitals:  Vitals:   04/22/16 2357 04/23/16 0358  BP: 134/75 133/85  Pulse: 68 (!) 59  Resp: 17 19  Temp: 36.7 C 36.6 C    Last Pain:  Vitals:   04/23/16 0400  TempSrc:   PainSc: 5                  Chiropodist

## 2016-04-24 LAB — BPAM RBC
Blood Product Expiration Date: 201805052359
Blood Product Expiration Date: 201805152359
UNIT TYPE AND RH: 600
Unit Type and Rh: 600

## 2016-04-24 LAB — TYPE AND SCREEN
ABO/RH(D): A NEG
Antibody Screen: POSITIVE
UNIT DIVISION: 0
Unit division: 0

## 2016-04-24 MED ORDER — NORETHINDRONE 0.35 MG PO TABS: 1.0000 | ORAL_TABLET | Freq: Every day | ORAL | 11 refills | Status: DC

## 2016-04-24 MED ORDER — OXYCODONE HCL 5 MG PO TABS: 5.0000 mg | ORAL_TABLET | ORAL | 0 refills | Status: DC | PRN

## 2016-04-24 MED ORDER — IBUPROFEN 600 MG PO TABS: 600.0000 mg | ORAL_TABLET | Freq: Four times a day (QID) | ORAL | 0 refills | Status: DC

## 2016-04-24 MED ORDER — DOCUSATE SODIUM 100 MG PO CAPS: 100.0000 mg | ORAL_CAPSULE | Freq: Two times a day (BID) | ORAL | 0 refills | Status: DC

## 2016-04-24 NOTE — Progress Notes (Signed)
Reviewed all patients discharge instructions and handouts regarding postpartum bleeding, incision care, no intercourse for 6 weeks, signs and symptoms of mastitis and postpartum bleu's. Reviewed discharge instructions for newborn regarding proper cord care, how and when to bathe the newborn, nail care, proper way to take the baby's temperature, along with safe sleep. All questions have been answered at this time. Patient discharged via wheelchair with axillary.  

## 2016-05-28 ENCOUNTER — Encounter: Payer: Self-pay | Admitting: Obstetrics and Gynecology

## 2016-09-24 NOTE — H&P (Signed)
Sydney Lynn is a 41 y.o. female here forl/s BTL  .pt is interested in permanant sterilization   Past Medical History:  has a past medical history of Asthma without status asthmaticus, unspecified; H/O pre-eclampsia in prior pregnancy, currently pregnant; H/O spina bifida; and HSV-2 infection.  Past Surgical History:  has a past surgical history that includes Nasal surgery and Cesarean section. Family History: family history includes Brain cancer in her maternal grandmother and maternal uncle; Spina bifida in her brother and father; Uterine cancer in her mother. Social History:  reports that she has never smoked. She has never used smokeless tobacco. She reports that she does not drink alcohol or use drugs. OB/GYN History:          OB History    Gravida Para Term Preterm AB Living   SAB TAB Ectopic Molar Multiple Live Births             2      Obstetric Comments   NICU stay for 10 days       Allergies: is allergic to amoxicillin and bee venom protein (honey bee). Medications:  Current Outpatient Prescriptions:  .  albuterol 90 mcg/actuation inhaler, Inhale 2 inhalations into the lungs every 6 (six) hours as needed for Wheezing., Disp: 1 Inhaler, Rfl: 2 .  cetirizine (ZYRTEC) 10 MG tablet, Take 10 mg by mouth once daily., Disp: , Rfl:  .  norethindrone (MICRONOR) 0.35 mg tablet, , Disp: , Rfl:  .  prenatal vitamin-iron-FA-DHA-docusate (PRENEXA, FOLCAL DHA) capsule, Take 1 capsule by mouth once daily., Disp: , Rfl:  .  TRIAMCINOLONE ACETONIDE (NASACORT AQ NASAL), by Nasal route., Disp: , Rfl:  .  valACYclovir (VALTREX) 1000 MG tablet, Take 1 tablet (1,000 mg total) by mouth 2 (two) times daily., Disp: 30 tablet, Rfl: 0  Review of Systems: General:                      No fatigue or weight loss Eyes:                           No vision changes Ears:                            No hearing difficulty Respiratory:                No cough or shortness of  breath Pulmonary:                  No asthma or shortness of breath Cardiovascular:           No chest pain, palpitations, dyspnea on exertion Gastrointestinal:          No abdominal bloating, chronic diarrhea, constipations, masses, pain or hematochezia Genitourinary:             No hematuria, dysuria, abnormal vaginal discharge, pelvic pain, Menometrorrhagia Lymphatic:                   No swollen lymph nodes Musculoskeletal:         No muscle weakness Neurologic:                  No extremity weakness, syncope, seizure disorder Psychiatric:                  No history of depression, delusions or  suicidal/homicidal ideation    Exam:      Vitals:   09/25/16 1434  BP: 134/80  Pulse: 74    Body mass index is 24.37 kg/m.  WDWN white/  female in NAD   Lungs: CTA  CV : RRR without murmur   Neck:  no thyromegaly Abdomen: soft , no mass, normal active bowel sounds,  non-tender, no rebound tenderness Pelvic: tanner stage 5 ,  External genitalia: vulva /labia no lesions Urethra: no prolapse Vagina: normal physiologic d/c Cervix: no lesions, no cervical motion tenderness   Uterus: normal size shape and contour, non-tender Adnexa: no mass,  non-tender     Impression:   Elective permanent sterilization    Plan:   Benefits and risks to surgery:l/s BTL falope rings  The proposed benefit of the surgery has been discussed with the patient. The possible risks include, but are not limited to: organ injury to the bowel , bladder, ureters, and major blood vessels and nerves. There is a possibility of additional surgeries resulting from these injuries. There is also the risk of blood transfusion and the need to receive blood products during or after the procedure which may rarely lead to HIV or Hepatitis C infection. There is a risk of developing a deep venous thrombosis or a pulmonary embolism . There is the possibility of wound infection and also anesthetic complications,  even the rare possibility of death. The patient understands these risks and wishes to proceed. All questions have been answered and the consent has been signed.  failure rate of 1/300 discussed with pt    Vilma Prader, MD

## 2016-09-26 ENCOUNTER — Encounter
Admission: RE | Admit: 2016-09-26 | Discharge: 2016-09-26 | Disposition: A | Discharge status: Home / Self Care | Payer: BC Managed Care – PPO | Source: Ambulatory Visit | Attending: Obstetrics and Gynecology | Admitting: Obstetrics and Gynecology

## 2016-09-26 HISTORY — DX: Other complications of anesthesia, initial encounter: T88.59XA

## 2016-09-26 HISTORY — DX: Adverse effect of unspecified anesthetic, initial encounter: T41.45XA

## 2016-09-26 HISTORY — DX: Essential (primary) hypertension: I10

## 2016-09-26 NOTE — Patient Instructions (Signed)
  Your procedure is scheduled on: 10-04-16 FRIDAY Report to Same Day Surgery 2nd floor medical mall [facility] Entrance-take elevator on left to 2nd floor.  Check in with surgery information desk.) To find out your arrival time please call [phone] between 1PM - 3PM on 10-03-16 THURSDAY  Remember: Instructions that are not followed completely may result in serious medical risk, up to and including death, or upon the discretion of your surgeon and anesthesiologist your surgery may need to be rescheduled.    _x___ 1. Do not eat food after midnight the night before your procedure. NO GUM CHEWING OR HARD CANDIES.  You may drink clear liquids up to 2 hours before you are scheduled to arrive at the hospital for your procedure.  Do not drink clear liquids within 2 hours of your scheduled arrival to the hospital.  Clear liquids include  --Water or Apple juice without pulp  --Clear carbohydrate beverage such as ClearFast or Gatorade  --Black Coffee or Clear Tea (No milk, no creamers, do not add anything to the coffee or Tea)  Type 1 and type 2 diabetics should only drink water.       __x__ 2. No Alcohol for 24 hours before or after surgery.   __x__3. No Smoking for 24 prior to surgery.   ____  4. Bring all medications with you on the day of surgery if instructed.    __x__ 5. Notify your doctor if there is any change in your medical condition     (cold, fever, infections).     Do not wear jewelry, make-up, hairpins, clips or nail polish.  Do not wear lotions, powders, or perfumes. You may wear deodorant.  Do not shave 48 hours prior to surgery. Men may shave face and neck.  Do not bring valuables to the hospital.    [facility] is not responsible for any belongings or valuables.               Contacts, dentures or bridgework may not be worn into surgery.  Leave your suitcase in the car. After surgery it may be brought to your room.  For patients admitted to the hospital, discharge time  is determined by yourtreatment team.   Patients discharged the day of surgery will not be allowed to drive home.  You will need someone to drive you home and stay with you the night of your procedure.    Please read over the following fact sheets that you were given:    ____ Take anti-hypertensive listed below, cardiac, seizure, asthma, anti-reflux and psychiatric medicines. These include:  1. NONE  2.  3.  4.  5.  6.  ____Fleets enema or Magnesium Citrate as directed.   _x___ Use CHG Soap or sage wipes as directed on instruction sheet   _X___ Use inhalers on the day of surgery and bring to hospital day of surgery-BRING ALBUTEROL INHALER TO HOSPITAL  ____ Stop Metformin and Janumet 2 days prior to surgery.    ____ Take 1/2 of usual insulin dose the night before surgery and none on the morning surgery.   ____ Follow recommendations from Cardiologist, Pulmonologist or PCP regarding stopping Aspirin, Coumadin, Plavix ,Eliquis, Effient, or Pradaxa, and Pletal.  X____Stop Anti-inflammatories such as Advil, Aleve, Ibuprofen, Motrin, Naproxen, Naprosyn, Goodies powders or aspirin products NOW-OK to take Tylenol    ____ Stop supplements until after surgery.     ____ Bring C-Pap to the hospital.

## 2016-10-01 ENCOUNTER — Encounter
Admission: RE | Admit: 2016-10-01 | Discharge: 2016-10-01 | Disposition: A | Discharge status: Home / Self Care | Payer: BC Managed Care – PPO | Source: Ambulatory Visit | Attending: Obstetrics and Gynecology | Admitting: Obstetrics and Gynecology

## 2016-10-01 DIAGNOSIS — Z880 Allergy status to penicillin: Secondary | ICD-10-CM

## 2016-10-01 DIAGNOSIS — Z01812 Encounter for preprocedural laboratory examination: Secondary | ICD-10-CM

## 2016-10-01 DIAGNOSIS — N736 Female pelvic peritoneal adhesions (postinfective): Secondary | ICD-10-CM | POA: Diagnosis not present

## 2016-10-01 DIAGNOSIS — J45909 Unspecified asthma, uncomplicated: Secondary | ICD-10-CM | POA: Insufficient documentation

## 2016-10-01 DIAGNOSIS — Z79899 Other long term (current) drug therapy: Secondary | ICD-10-CM

## 2016-10-01 DIAGNOSIS — Z91030 Bee allergy status: Secondary | ICD-10-CM

## 2016-10-01 DIAGNOSIS — Z9889 Other specified postprocedural states: Secondary | ICD-10-CM

## 2016-10-01 DIAGNOSIS — Z8489 Family history of other specified conditions: Secondary | ICD-10-CM | POA: Insufficient documentation

## 2016-10-01 DIAGNOSIS — Z808 Family history of malignant neoplasm of other organs or systems: Secondary | ICD-10-CM

## 2016-10-01 DIAGNOSIS — Z302 Encounter for sterilization: Secondary | ICD-10-CM

## 2016-10-01 DIAGNOSIS — B009 Herpesviral infection, unspecified: Secondary | ICD-10-CM | POA: Diagnosis not present

## 2016-10-01 LAB — TYPE AND SCREEN
ABO/RH(D): A NEG
Antibody Screen: NEGATIVE

## 2016-10-01 LAB — CBC
HEMATOCRIT: 38.2 % (ref 35.0–47.0)
Hemoglobin: 13.4 g/dL (ref 12.0–16.0)
MCH: 32.3 pg (ref 26.0–34.0)
MCHC: 35.2 g/dL (ref 32.0–36.0)
MCV: 92 fL (ref 80.0–100.0)
Platelets: 244 10*3/uL (ref 150–440)
RBC: 4.15 MIL/uL (ref 3.80–5.20)
RDW: 12.2 % (ref 11.5–14.5)
WBC: 7.1 10*3/uL (ref 3.6–11.0)

## 2016-10-01 LAB — BASIC METABOLIC PANEL
Anion gap: 9 (ref 5–15)
BUN: 19 mg/dL (ref 6–20)
CHLORIDE: 104 mmol/L (ref 101–111)
CO2: 26 mmol/L (ref 22–32)
Calcium: 9.4 mg/dL (ref 8.9–10.3)
Creatinine, Ser: 0.37 mg/dL — ABNORMAL LOW (ref 0.44–1.00)
GFR calc Af Amer: >60 mL/min (ref >60)
GFR calc non Af Amer: >60 mL/min (ref >60)
GLUCOSE: 102 mg/dL — AB (ref 65–99)
POTASSIUM: 3.9 mmol/L (ref 3.5–5.1)
Sodium: 139 mmol/L (ref 135–145)

## 2016-10-03 ENCOUNTER — Telehealth: Payer: Self-pay | Admitting: Obstetrics and Gynecology

## 2016-10-03 NOTE — Telephone Encounter (Signed)
Called pt she states that she received a panorma bill and would like to speak with the rep. Gave pt name and infor for [employer].

## 2016-10-03 NOTE — Telephone Encounter (Signed)
Patient called and stated that she has a few questions in regards to the [employer] bill she received in the mail. No other information was disclosed other than wanting to speak with someone in regards to lowering the bill. Please advise.

## 2016-10-04 ENCOUNTER — Encounter
Admission: RE | Disposition: A | Discharge status: Home / Self Care | Payer: Self-pay | Source: Ambulatory Visit | Attending: Obstetrics and Gynecology

## 2016-10-04 ENCOUNTER — Ambulatory Visit
Admission: RE | Admit: 2016-10-04 | Discharge: 2016-10-04 | Disposition: A | Discharge status: Home / Self Care | Payer: BC Managed Care – PPO | Source: Ambulatory Visit | Attending: Obstetrics and Gynecology | Admitting: Obstetrics and Gynecology

## 2016-10-04 ENCOUNTER — Ambulatory Visit: Payer: BC Managed Care – PPO | Admitting: Anesthesiology

## 2016-10-04 ENCOUNTER — Encounter: Payer: Self-pay | Admitting: *Deleted

## 2016-10-04 DIAGNOSIS — Z79899 Other long term (current) drug therapy: Secondary | ICD-10-CM | POA: Insufficient documentation

## 2016-10-04 DIAGNOSIS — J45909 Unspecified asthma, uncomplicated: Secondary | ICD-10-CM | POA: Insufficient documentation

## 2016-10-04 DIAGNOSIS — B009 Herpesviral infection, unspecified: Secondary | ICD-10-CM | POA: Insufficient documentation

## 2016-10-04 DIAGNOSIS — Z302 Encounter for sterilization: Secondary | ICD-10-CM | POA: Diagnosis not present

## 2016-10-04 DIAGNOSIS — N736 Female pelvic peritoneal adhesions (postinfective): Secondary | ICD-10-CM | POA: Insufficient documentation

## 2016-10-04 HISTORY — PX: LAPAROSCOPIC TUBAL LIGATION: SHX1937

## 2016-10-04 HISTORY — PX: LAPAROSCOPIC LYSIS OF ADHESIONS: SHX5905

## 2016-10-04 LAB — POCT PREGNANCY, URINE: PREG TEST UR: NEGATIVE

## 2016-10-04 SURGERY — LIGATION, FALLOPIAN TUBE, LAPAROSCOPIC
Anesthesia: General | Laterality: Bilateral | Wound class: Clean Contaminated

## 2016-10-04 MED ORDER — ROCURONIUM BROMIDE 100 MG/10ML IV SOLN
INTRAVENOUS | Status: DC | PRN
  Administered 2016-10-04: 15 mg via INTRAVENOUS
  Administered 2016-10-04: 5 mg via INTRAVENOUS
  Administered 2016-10-04: 10 mg via INTRAVENOUS

## 2016-10-04 MED ORDER — ONDANSETRON HCL 4 MG/2ML IJ SOLN
INTRAMUSCULAR | Status: AC
  Filled 2016-10-04: qty 2

## 2016-10-04 MED ORDER — FAMOTIDINE 20 MG PO TABS
ORAL_TABLET | ORAL | Status: AC
  Administered 2016-10-04: 20 mg via ORAL
  Filled 2016-10-04: qty 1

## 2016-10-04 MED ORDER — FENTANYL CITRATE (PF) 100 MCG/2ML IJ SOLN
INTRAMUSCULAR | Status: AC
  Filled 2016-10-04: qty 2

## 2016-10-04 MED ORDER — FAMOTIDINE 20 MG PO TABS
20.0000 mg | ORAL_TABLET | Freq: Once | ORAL | Status: AC
  Administered 2016-10-04: 20 mg via ORAL

## 2016-10-04 MED ORDER — SUCCINYLCHOLINE CHLORIDE 20 MG/ML IJ SOLN
INTRAMUSCULAR | Status: AC
Start: 2016-10-04 — End: 2016-10-04
  Filled 2016-10-04: qty 2

## 2016-10-04 MED ORDER — LIDOCAINE 2% (20 MG/ML) 5 ML SYRINGE
INTRAMUSCULAR | Status: DC | PRN
  Administered 2016-10-04: 80 mg via INTRAVENOUS

## 2016-10-04 MED ORDER — OXYCODONE HCL 5 MG PO TABS
ORAL_TABLET | ORAL | Status: AC
  Administered 2016-10-04: 5 mg via ORAL
  Filled 2016-10-04: qty 1

## 2016-10-04 MED ORDER — OXYCODONE HCL 5 MG PO TABS
5.0000 mg | ORAL_TABLET | Freq: Once | ORAL | Status: AC | PRN
  Administered 2016-10-04: 5 mg via ORAL

## 2016-10-04 MED ORDER — OXYCODONE HCL 5 MG PO TABS
ORAL_TABLET | ORAL | Status: AC
  Filled 2016-10-04: qty 1

## 2016-10-04 MED ORDER — FENTANYL CITRATE (PF) 100 MCG/2ML IJ SOLN
INTRAMUSCULAR | Status: AC
  Administered 2016-10-04: 25 ug via INTRAVENOUS
  Filled 2016-10-04: qty 2

## 2016-10-04 MED ORDER — SUCCINYLCHOLINE CHLORIDE 20 MG/ML IJ SOLN
INTRAMUSCULAR | Status: DC | PRN
  Administered 2016-10-04: 80 mg via INTRAVENOUS

## 2016-10-04 MED ORDER — FENTANYL CITRATE (PF) 100 MCG/2ML IJ SOLN
INTRAMUSCULAR | Status: AC
  Administered 2016-10-04: 50 ug via INTRAVENOUS
  Filled 2016-10-04: qty 2

## 2016-10-04 MED ORDER — SUGAMMADEX SODIUM 200 MG/2ML IV SOLN
INTRAVENOUS | Status: AC
  Filled 2016-10-04: qty 2

## 2016-10-04 MED ORDER — BUPIVACAINE HCL (PF) 0.5 % IJ SOLN
INTRAMUSCULAR | Status: AC
  Filled 2016-10-04: qty 30

## 2016-10-04 MED ORDER — PROPOFOL 10 MG/ML IV BOLUS
INTRAVENOUS | Status: DC | PRN
  Administered 2016-10-04: 120 mg via INTRAVENOUS

## 2016-10-04 MED ORDER — PHENYLEPHRINE HCL 10 MG/ML IJ SOLN
INTRAMUSCULAR | Status: DC | PRN
  Administered 2016-10-04: 100 ug via INTRAVENOUS

## 2016-10-04 MED ORDER — MIDAZOLAM HCL 2 MG/2ML IJ SOLN
INTRAMUSCULAR | Status: DC | PRN
  Administered 2016-10-04: 2 mg via INTRAVENOUS

## 2016-10-04 MED ORDER — SUCCINYLCHOLINE CHLORIDE 20 MG/ML IJ SOLN
INTRAMUSCULAR | Status: AC
  Filled 2016-10-04: qty 1

## 2016-10-04 MED ORDER — HYDROMORPHONE HCL 1 MG/ML IJ SOLN
0.5000 mg | INTRAMUSCULAR | Status: DC | PRN
  Administered 2016-10-04: 0.5 mg via INTRAVENOUS

## 2016-10-04 MED ORDER — BUPIVACAINE HCL 0.5 % IJ SOLN
INTRAMUSCULAR | Status: DC | PRN
  Administered 2016-10-04: 10 mL

## 2016-10-04 MED ORDER — SUGAMMADEX SODIUM 200 MG/2ML IV SOLN
INTRAVENOUS | Status: DC | PRN
Start: 2016-10-04 — End: 2016-10-04
  Administered 2016-10-04: 127 mg via INTRAVENOUS

## 2016-10-04 MED ORDER — DEXAMETHASONE SODIUM PHOSPHATE 10 MG/ML IJ SOLN
INTRAMUSCULAR | Status: AC
  Filled 2016-10-04: qty 1

## 2016-10-04 MED ORDER — MIDAZOLAM HCL 2 MG/2ML IJ SOLN
INTRAMUSCULAR | Status: AC
  Filled 2016-10-04: qty 2

## 2016-10-04 MED ORDER — HYDROMORPHONE HCL 1 MG/ML IJ SOLN
INTRAMUSCULAR | Status: AC
  Filled 2016-10-04: qty 1

## 2016-10-04 MED ORDER — FENTANYL CITRATE (PF) 100 MCG/2ML IJ SOLN
INTRAMUSCULAR | Status: DC | PRN
  Administered 2016-10-04: 100 ug via INTRAVENOUS

## 2016-10-04 MED ORDER — PROPOFOL 10 MG/ML IV BOLUS
INTRAVENOUS | Status: AC
  Filled 2016-10-04: qty 20

## 2016-10-04 MED ORDER — PROPOFOL 10 MG/ML IV BOLUS
INTRAVENOUS | Status: AC
Start: 2016-10-04 — End: 2016-10-04
  Filled 2016-10-04: qty 20

## 2016-10-04 MED ORDER — OXYCODONE HCL 5 MG/5ML PO SOLN: 5.0000 mg | Freq: Once | ORAL | Status: AC | PRN

## 2016-10-04 MED ORDER — ONDANSETRON HCL 4 MG/2ML IJ SOLN
INTRAMUSCULAR | Status: DC | PRN
  Administered 2016-10-04: 4 mg via INTRAVENOUS

## 2016-10-04 MED ORDER — OXYCODONE HCL 5 MG PO TABS
5.0000 mg | ORAL_TABLET | Freq: Once | ORAL | Status: AC
  Administered 2016-10-04: 5 mg via ORAL
  Filled 2016-10-04: qty 1

## 2016-10-04 MED ORDER — DEXAMETHASONE SODIUM PHOSPHATE 10 MG/ML IJ SOLN
INTRAMUSCULAR | Status: DC | PRN
  Administered 2016-10-04: 10 mg via INTRAVENOUS

## 2016-10-04 MED ORDER — LACTATED RINGERS IV SOLN
INTRAVENOUS | Status: DC
  Administered 2016-10-04 (×2): via INTRAVENOUS

## 2016-10-04 MED ORDER — FENTANYL CITRATE (PF) 100 MCG/2ML IJ SOLN
25.0000 ug | INTRAMUSCULAR | Status: DC | PRN
  Administered 2016-10-04 (×2): 50 ug via INTRAVENOUS
  Administered 2016-10-04 (×2): 25 ug via INTRAVENOUS

## 2016-10-04 MED ORDER — SILVER NITRATE-POT NITRATE 75-25 % EX MISC
CUTANEOUS | Status: AC
  Filled 2016-10-04: qty 1

## 2016-10-04 MED ORDER — ROCURONIUM BROMIDE 50 MG/5ML IV SOLN
INTRAVENOUS | Status: AC
  Filled 2016-10-04: qty 1

## 2016-10-04 MED ORDER — LACTATED RINGERS IV SOLN: INTRAVENOUS | Status: DC

## 2016-10-04 MED ORDER — KETOROLAC TROMETHAMINE 30 MG/ML IJ SOLN
INTRAMUSCULAR | Status: DC | PRN
  Administered 2016-10-04: 30 mg via INTRAVENOUS

## 2016-10-04 SURGICAL SUPPLY — 32 items
APR BAND 32X8 2 INCS BAND (Ring) ×2 IMPLANT
BLADE SURG SZ11 CARB STEEL (BLADE) ×4 IMPLANT
CATH ROBINSON RED A/P 16FR (CATHETERS) ×4 IMPLANT
CLOSURE WOUND 1/4X4 (GAUZE/BANDAGES/DRESSINGS) ×1
DRSG TEGADERM 2-3/8X2-3/4 SM (GAUZE/BANDAGES/DRESSINGS) ×8 IMPLANT
GAUZE SPONGE NON-WVN 2X2 STRL (MISCELLANEOUS) ×2 IMPLANT
GLOVE BIO SURGEON STRL SZ8 (GLOVE) ×4 IMPLANT
GOWN STRL REUS W/ TWL LRG LVL3 (GOWN DISPOSABLE) ×2 IMPLANT
GOWN STRL REUS W/ TWL XL LVL3 (GOWN DISPOSABLE) ×2 IMPLANT
GOWN STRL REUS W/TWL LRG LVL3 (GOWN DISPOSABLE) ×4
GOWN STRL REUS W/TWL XL LVL3 (GOWN DISPOSABLE) ×4
KIT DISPOSABLE FALLOPE RING (Ring) ×4 IMPLANT
KIT PINK PAD W/HEAD ARE REST (MISCELLANEOUS) ×4
KIT PINK PAD W/HEAD ARM REST (MISCELLANEOUS) ×2 IMPLANT
KIT RM TURNOVER CYSTO AR (KITS) ×4 IMPLANT
LABEL OR SOLS (LABEL) ×4 IMPLANT
NS IRRIG 500ML POUR BTL (IV SOLUTION) ×4 IMPLANT
PACK GYN LAPAROSCOPIC (MISCELLANEOUS) ×4 IMPLANT
PAD OB MATERNITY 4.3X12.25 (PERSONAL CARE ITEMS) ×4 IMPLANT
PAD PREP 24X41 OB/GYN DISP (PERSONAL CARE ITEMS) ×4 IMPLANT
SHEARS HARMONIC ACE PLUS 36CM (ENDOMECHANICALS) ×2 IMPLANT
SLEEVE ENDOPATH XCEL 5M (ENDOMECHANICALS) ×2 IMPLANT
SPONGE VERSALON 2X2 STRL (MISCELLANEOUS) ×4
STRIP CLOSURE SKIN 1/4X4 (GAUZE/BANDAGES/DRESSINGS) ×3 IMPLANT
SUT VIC AB 0 CT1 36 (SUTURE) ×4 IMPLANT
SUT VIC AB 2-0 UR6 27 (SUTURE) ×4 IMPLANT
SUT VIC AB 4-0 SH 27 (SUTURE) ×4
SUT VIC AB 4-0 SH 27XANBCTRL (SUTURE) ×2 IMPLANT
SWABSTK COMLB BENZOIN TINCTURE (MISCELLANEOUS) ×4 IMPLANT
TROCAR ENDO BLADELESS 11MM (ENDOMECHANICALS) IMPLANT
TROCAR XCEL NON-BLD 5MMX100MML (ENDOMECHANICALS) ×4 IMPLANT
TUBING INSUFFLATOR HI FLOW (MISCELLANEOUS) ×4 IMPLANT

## 2016-10-04 NOTE — Op Note (Signed)
NAME:  Sydney Lynn, Sydney Lynn NO.:  [id number]  MEDICAL RECORD NO.:  [id number]  LOCATION:                                 FACILITY:  PHYSICIAN:  Jennell Corner, MD     DATE OF BIRTH:  DATE OF PROCEDURE:  10/04/2016 DATE OF DISCHARGE:                              OPERATIVE REPORT   PREOPERATIVE DIAGNOSIS:  Elective permanent sterilization.  POSTOPERATIVE DIAGNOSIS: 1. Pelvic adhesive disease. 2. Elective sterilization.  PROCEDURE PERFORMED: 1. Pelvic adhesiolysis incorporating greater than 50% of operating     time. 2. Bilateral tubal ligation, Falope rings.  SURGEON:  Jennell Corner, MD  ANESTHESIA:  General endotracheal anesthesia.  SURGEON:  Jennell Corner, MD  FIRST ASSISTANT:  Corwin Levins, PA student.  INDICATION:  A 41 year old gravida 2, para 2.  The patient desires permanent sterilization.  DESCRIPTION OF PROCEDURE:  After adequate general endotracheal anesthesia, the patient was placed in dorsal supine position.  The patient's abdomen, perineum, and vagina were prepped and draped in a normal sterile fashion.  Sponge stick was placed in the vagina to be used for uterine manipulation during the procedure.  Time-out was performed.  Bladder was catheterized yielding 350 mL of clear urine. Gloves were changed.  Attention directed to the patient's abdomen.  A 7 mm infraumbilical incision was made, and a 5 mm scope was advanced into the abdominal cavity with the Optiview cannula.  The patient's abdomen was insufflated.  Initial impression; significant omental adhesions to the anterior abdominal wall in the lower pelvis.  Second port was placed in the left lower quadrant 3 cm medial to the left anterior iliac spine. A 5 mm port was advanced under direct visualization.  Harmonic scalpel was brought up to the operative field, and dense omental adhesions were taken down from the anterior abdominal wall as well as omental adhesions attached to  the uterus.  Intraoperative pictures were taken.  Again, lysis of adhesions incorporating greater than 50% of total operating time.  A 7 mm port was then advanced through the previously placed 5 mm port, and the Falope ring applier was advanced into the abdominal cavity, and the right fallopian tube was grasped at the isthmic ampullary portion of fallopian tube, and a small knuckle of fallopian tube resulted.  Additional cautery was used on the fallopian tube given the size of the resulting knuckle of fallopian tube.  Falope ring was then applied on the left fallopian tube at the isthmic ampullary portion of fallopian tube with a resulting 1.5 cm knuckle fallopian tube.  Good hemostasis was noted.  There were no complications.  Upper abdomen appeared normal.  The patient's abdomen was deflated, and instruments were removed.  Each incision was closed with 4-0 Vicryl interrupted suture.  Sterile dressing applied.  Sponge stick was removed.  The patient tolerated the procedure well.  ESTIMATED BLOOD LOSS:  Minimal.  INTRAOPERATIVE FLUIDS:  700 mL.  URINE OUTPUT:  350 mL.  DISPOSITION:  The patient was taken to the recovery room in good condition.    ______________________________ Jennell Corner, MD   ______________________________ Jennell Corner, MD    TS/MEDQ  D:  10/04/2016  T:  10/04/2016  Job:  [id number]

## 2016-10-04 NOTE — Anesthesia Postprocedure Evaluation (Signed)
Anesthesia Post Note  Patient: Sydney Lynn  Procedure(s) Performed: Procedure(s) (LRB): LAPAROSCOPIC TUBAL LIGATION (Bilateral) LAPAROSCOPIC LYSIS OF ADHESIONS  Patient location during evaluation: PACU Anesthesia Type: General Level of consciousness: awake and alert Pain management: pain level controlled Vital Signs Assessment: post-procedure vital signs reviewed and stable Respiratory status: spontaneous breathing, nonlabored ventilation, respiratory function stable and patient connected to nasal cannula oxygen Cardiovascular status: blood pressure returned to baseline and stable Postop Assessment: no apparent nausea or vomiting Anesthetic complications: no     Last Vitals:  Vitals:   10/04/16 0906 10/04/16 0916  BP: 126/83 (!) 136/92  Pulse: 71 65  Resp: 13 14  Temp: (!) 36.2 C (!) 36.3 C  SpO2: 99% 100%    Last Pain:  Vitals:   10/04/16 0916  TempSrc: Temporal  PainSc: 5                  Cleda Mccreedy Kaelen Brennan

## 2016-10-04 NOTE — Anesthesia Procedure Notes (Signed)
Procedure Name: Intubation Date/Time: 10/04/2016 7:30 AM Performed by: Paulette Blanch Pre-anesthesia Checklist: Patient identified, Patient being monitored, Timeout performed, Emergency Drugs available and Suction available Patient Re-evaluated:Patient Re-evaluated prior to induction Oxygen Delivery Method: Circle system utilized Preoxygenation: Pre-oxygenation with 100% oxygen Induction Type: IV induction Ventilation: Mask ventilation without difficulty Laryngoscope Size: 3 and Miller Grade View: Grade I Tube type: Oral Tube size: 7.5 mm Number of attempts: 1 Placement Confirmation: ETT inserted through vocal cords under direct vision,  positive ETCO2 and breath sounds checked- equal and bilateral Secured at: 21 cm Tube secured with: Tape Dental Injury: Teeth and Oropharynx as per pre-operative assessment

## 2016-10-04 NOTE — Discharge Instructions (Signed)
Diagnostic Laparoscopy, Care After °Refer to this sheet in the next few weeks. These instructions provide you with information about caring for yourself after your procedure. Your health care provider may also give you more specific instructions. Your treatment has been planned according to current medical practices, but problems sometimes occur. Call your health care provider if you have any problems or questions after your procedure. °What can I expect after the procedure? °After your procedure, it is common to have mild discomfort in the throat and abdomen. °Follow these instructions at home: °· Take over-the-counter and prescription medicines only as told by your health care provider. °· Do not drive for 24 hours if you received a sedative. °· Return to your normal activities as told by your health care provider. °· Do not take baths, swim, or use a hot tub until your health care provider approves. You may shower. °· Follow instructions from your health care provider about how to take care of your incision. Make sure you: °? Wash your hands with soap and water before you change your bandage (dressing). If soap and water are not available, use hand sanitizer. °? Change your dressing as told by your health care provider. °? Leave stitches (sutures), skin glue, or adhesive strips in place. These skin closures may need to stay in place for 2 weeks or longer. If adhesive strip edges start to loosen and curl up, you may trim the loose edges. Do not remove adhesive strips completely unless your health care provider tells you to do that. °· Check your incision area every day for signs of infection. Check for: °? More redness, swelling, or pain. °? More fluid or blood. °? Warmth. °? Pus or a bad smell. °· It is your responsibility to get the results of your procedure. Ask your health care provider or the department performing the procedure when your results will be ready. °Contact a health care provider if: °· There is  new pain in your shoulders. °· You feel light-headed or faint. °· You are unable to pass gas or unable to have a bowel movement. °· You feel nauseous or you vomit. °· You develop a rash. °· You have more redness, swelling, or pain around your incision. °· You have more fluid or blood coming from your incision. °· Your incision feels warm to the touch. °· You have pus or a bad smell coming from your incision. °· You have a fever or chills. °Get help right away if: °· Your pain is getting worse. °· You have ongoing vomiting. °· The edges of your incision open up. °· You have trouble breathing. °· You have chest pain. °This information is not intended to replace advice given to you by your health care provider. Make sure you discuss any questions you have with your health care provider. °Document Released: 12/05/2014 Document Revised: 06/01/2015 Document Reviewed: 09/06/2014 °Elsevier Interactive Patient Education © 2018 [employer] ° °AMBULATORY SURGERY  °DISCHARGE INSTRUCTIONS ° ° °1) The drugs that you were given will stay in your system until tomorrow so for the next 24 hours you should not: ° °A) Drive an automobile °B) Make any legal decisions °C) Drink any alcoholic beverage ° ° °2) You may resume regular meals tomorrow.  Today it is better to start with liquids and gradually work up to solid foods. ° °You may eat anything you prefer, but it is better to start with liquids, then soup and crackers, and gradually work up to solid foods. ° ° °3)   notify your doctor immediately if you have any unusual bleeding, trouble breathing, redness and pain at the surgery site, drainage, fever, or pain not relieved by medication.    4) Additional Instructions:        Please contact your physician with any problems or Same Day Surgery at [phone], Monday through Friday 6 am to 4 pm, or [place] at [facility] number at [phone].AMBULATORY SURGERY  DISCHARGE INSTRUCTIONS   5) The drugs that you  were given will stay in your system until tomorrow so for the next 24 hours you should not:  D) Drive an automobile E) Make any legal decisions F) Drink any alcoholic beverage   6) You may resume regular meals tomorrow.  Today it is better to start with liquids and gradually work up to solid foods.  You may eat anything you prefer, but it is better to start with liquids, then soup and crackers, and gradually work up to solid foods.   7) Please notify your doctor immediately if you have any unusual bleeding, trouble breathing, redness and pain at the surgery site, drainage, fever, or pain not relieved by medication.    8) Additional Instructions:        Please contact your physician with any problems or Same Day Surgery at [phone], Monday through Friday 6 am to 4 pm, or [place] at [facility] number at [phone].

## 2016-10-04 NOTE — Anesthesia Post-op Follow-up Note (Signed)
Anesthesia QCDR form completed.        

## 2016-10-04 NOTE — Anesthesia Preprocedure Evaluation (Signed)
Anesthesia Evaluation  Patient identified by MRN, date of birth, ID band Patient awake    Reviewed: Allergy & Precautions, H&P , NPO status , Patient's Chart, lab work & pertinent test results  History of Anesthesia Complications (+) history of anesthetic complications  Airway Mallampati: II  TM Distance: >3 FB Neck ROM: full    Dental  (+) Poor Dentition   Pulmonary asthma ,           Cardiovascular Exercise Tolerance: Good hypertension, (-) angina(-) Past MI and (-) DOE      Neuro/Psych negative neurological ROS  negative psych ROS   GI/Hepatic negative GI ROS, Neg liver ROS, neg GERD  ,  Endo/Other  negative endocrine ROS  Renal/GU      Musculoskeletal   Abdominal   Peds  Hematology negative hematology ROS (+)   Anesthesia Other Findings Past Medical History: No date: Asthma     Comment:  Exercise Induced No date: Complication of anesthesia     Comment:  ITCHING  No date: H/O spina bifida     Comment:  Type 1 No date: HSV-2 infection No date: Hypertension     Comment:  WITH PREGNANCY ONLY-NORMALIZED AFTER DELIVERY  Past Surgical History: 01/27/2015: CESAREAN SECTION; N/A     Comment:  Procedure: CESAREAN SECTION;  Surgeon: Hildred Laser, MD;              Location: [facility] ORS;  Service: Obstetrics;  Laterality:               N/A; 04/21/2016: CESAREAN SECTION; N/A     Comment:  Procedure: CESAREAN SECTION;  Surgeon: Suzy Bouchard, MD;  Location: [facility] ORS;  Service: Obstetrics;               Laterality: N/A; No date: NASAL SINUS SURGERY  BMI    Body Mass Index:  24.03 kg/m      Reproductive/Obstetrics negative OB ROS                             Anesthesia Physical Anesthesia Plan  ASA: III  Anesthesia Plan: General ETT   Post-op Pain Management:    Induction: Intravenous  PONV Risk Score and Plan: 4 or greater and Ondansetron, Dexamethasone,  Midazolam and Treatment may vary due to age or medical condition  Airway Management Planned: Oral ETT  Additional Equipment:   Intra-op Plan:   Post-operative Plan: Extubation in OR  Informed Consent: I have reviewed the patients History and Physical, chart, labs and discussed the procedure including the risks, benefits and alternatives for the proposed anesthesia with the patient or authorized representative who has indicated his/her understanding and acceptance.   Dental Advisory Given  Plan Discussed with: Anesthesiologist, CRNA and Surgeon  Anesthesia Plan Comments: (Patient consented for risks of anesthesia including but not limited to:  - adverse reactions to medications - damage to teeth, lips or other oral mucosa - sore throat or hoarseness - Damage to heart, brain, lungs or loss of life  Patient voiced understanding.)        Anesthesia Quick Evaluation

## 2016-10-04 NOTE — Transfer of Care (Signed)
Immediate Anesthesia Transfer of Care Note  Patient: Sydney Lynn  Procedure(s) Performed: Procedure(s): LAPAROSCOPIC TUBAL LIGATION (Bilateral) LAPAROSCOPIC LYSIS OF ADHESIONS  Patient Location: PACU  Anesthesia Type:General  Level of Consciousness: awake, alert  and oriented  Airway & Oxygen Therapy: Patient Spontanous Breathing and Patient connected to nasal cannula oxygen  Post-op Assessment: Report given to RN and Post -op Vital signs reviewed and stable  Post vital signs: Reviewed and stable  Last Vitals:  Vitals:   10/04/16 0618 10/04/16 0824  BP: 123/79 (!) 148/84  Pulse: 86 86  Resp: 16 16  Temp: 36.4 C (!) 36 C  SpO2: 100% 100%    Last Pain:  Vitals:   10/04/16 0618  TempSrc: Oral         Complications: No apparent anesthesia complications

## 2016-10-04 NOTE — Brief Op Note (Signed)
10/04/2016  8:15 AM  PATIENT:  Clarita Leber  41 y.o. female  PRE-OPERATIVE DIAGNOSIS:  Elective Sterilization  POST-OPERATIVE DIAGNOSIS: pelvic adhesions , Sterilization  PROCEDURE:  Procedure(s): LAPAROSCOPIC TUBAL LIGATION (Bilateral) LAPAROSCOPIC LYSIS OF ADHESIONS  SURGEON:  Surgeon(s) and Role:    * Cassondra Stachowski, Ihor Austin, MD - Primary  PHYSICIAN ASSISTANT: Corwin Levins , PA student   ASSISTANTS: none   ANESTHESIA:   general  EBL:  Total I/O In: 700 [I.V.:700] Out: 360 [Blood:360]  BLOOD ADMINISTERED:none  DRAINS: none   LOCAL MEDICATIONS USED:  MARCAINE     SPECIMEN:  No Specimen  DISPOSITION OF SPECIMEN:  N/A  COUNTS:  YES  TOURNIQUET:  * No tourniquets in log *  DICTATION: .Other Dictation: Dictation Number verbal   PLAN OF CARE: Discharge to home after PACU  PATIENT DISPOSITION:  PACU - hemodynamically stable.   Delay start of Pharmacological VTE agent (>24hrs) due to surgical blood loss or risk of bleeding: not applicable

## 2016-10-04 NOTE — Progress Notes (Signed)
Npo  Labs nl  Neg HCG  Ready for L/S BTL

## 2016-10-04 NOTE — OR Nursing (Signed)
Patient reports continued pain of 8/10 moving from lower back to her abd. Dr. Feliberto Gottron notified and an additional oxycodone given as ordered. Heat pack to lower back provided.

## 2017-06-03 ENCOUNTER — Ambulatory Visit
Admission: EM | Admit: 2017-06-03 | Discharge: 2017-06-03 | Disposition: A | Discharge status: Home / Self Care | Payer: BC Managed Care – PPO

## 2017-06-03 ENCOUNTER — Encounter: Payer: Self-pay | Admitting: Emergency Medicine

## 2017-06-03 ENCOUNTER — Ambulatory Visit (INDEPENDENT_AMBULATORY_CARE_PROVIDER_SITE_OTHER): Payer: BC Managed Care – PPO

## 2017-06-03 ENCOUNTER — Other Ambulatory Visit: Payer: Self-pay

## 2017-06-03 DIAGNOSIS — W2209XA Striking against other stationary object, initial encounter: Secondary | ICD-10-CM | POA: Diagnosis not present

## 2017-06-03 DIAGNOSIS — S022XXA Fracture of nasal bones, initial encounter for closed fracture: Secondary | ICD-10-CM

## 2017-06-03 MED ORDER — NAPROXEN 500 MG PO TABS: 500.0000 mg | ORAL_TABLET | Freq: Two times a day (BID) | ORAL | 0 refills | Status: AC

## 2017-06-03 NOTE — Discharge Instructions (Addendum)
Apply ice 20 minutes out of every 2 hours 4-5 times daily for comfort.  °
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
## 2017-07-24 ENCOUNTER — Other Ambulatory Visit: Payer: Self-pay | Admitting: Obstetrics and Gynecology

## 2017-07-24 DIAGNOSIS — Z1231 Encounter for screening mammogram for malignant neoplasm of breast: Secondary | ICD-10-CM

## 2017-07-31 ENCOUNTER — Ambulatory Visit
Admission: RE | Admit: 2017-07-31 | Discharge: 2017-07-31 | Disposition: A | Discharge status: Home / Self Care | Payer: BC Managed Care – PPO | Source: Ambulatory Visit | Attending: Obstetrics and Gynecology | Admitting: Obstetrics and Gynecology

## 2017-07-31 ENCOUNTER — Encounter: Payer: Self-pay | Admitting: Radiology

## 2017-07-31 DIAGNOSIS — Z1231 Encounter for screening mammogram for malignant neoplasm of breast: Secondary | ICD-10-CM | POA: Insufficient documentation

## 2018-05-17 IMAGING — US US MFM OB DETAIL+14 WK
1 series · 12 of 28 positions shown · non-contrast
Comparison: none

[Series 1: us mfm ob detail+14 wk · 0.22mm/px · 12 of 77 slices shown]
[im 3/77]
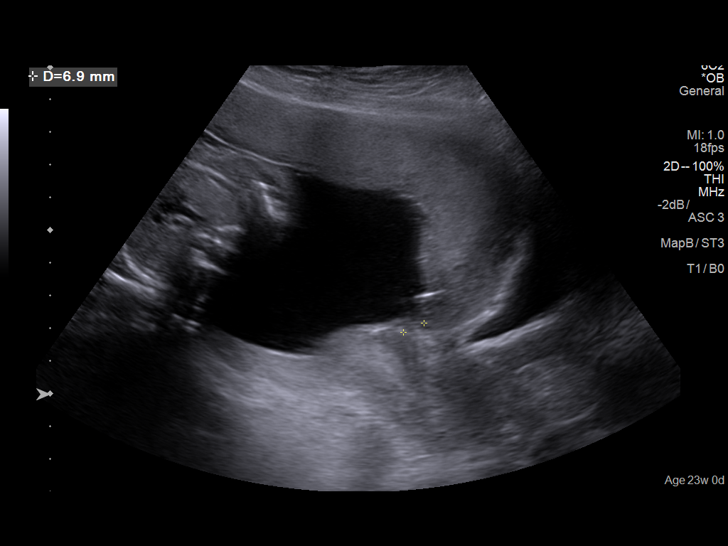
[im 9/77]
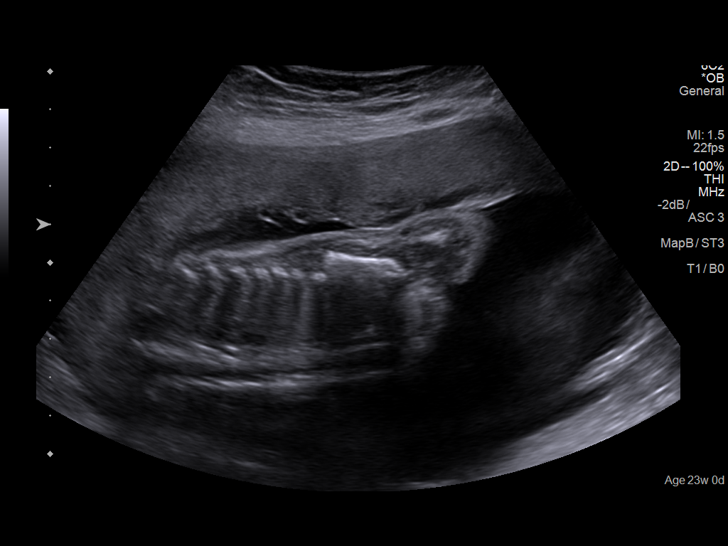
[im 15/77]
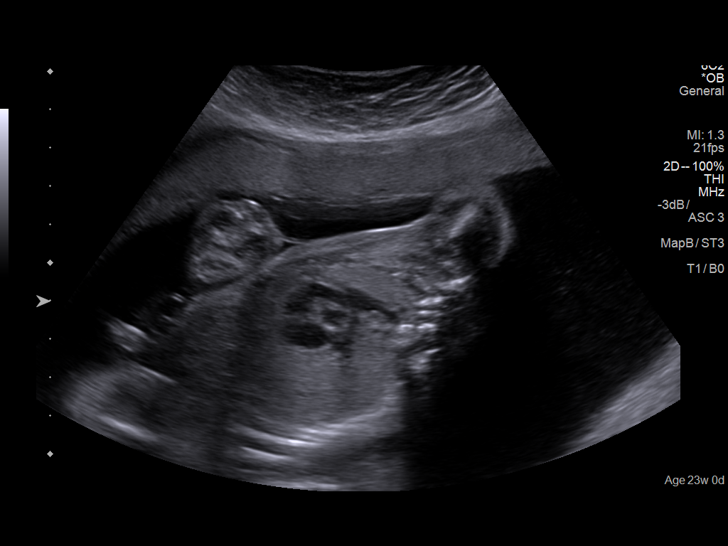
[im 23/77]
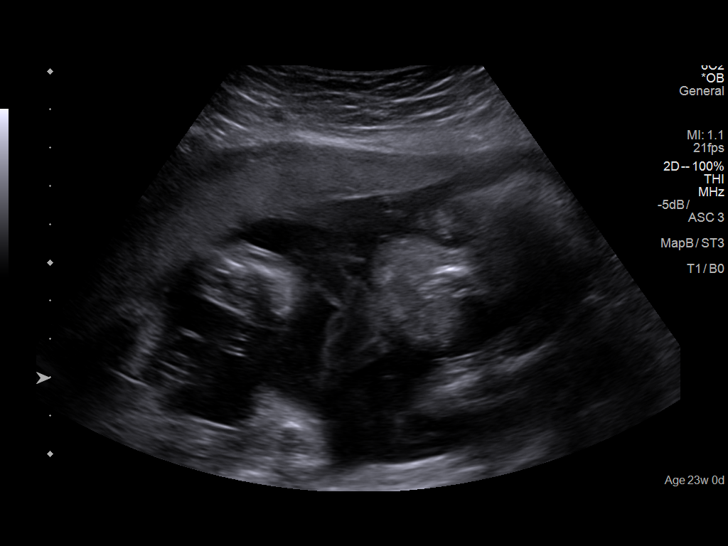
[im 29/77]
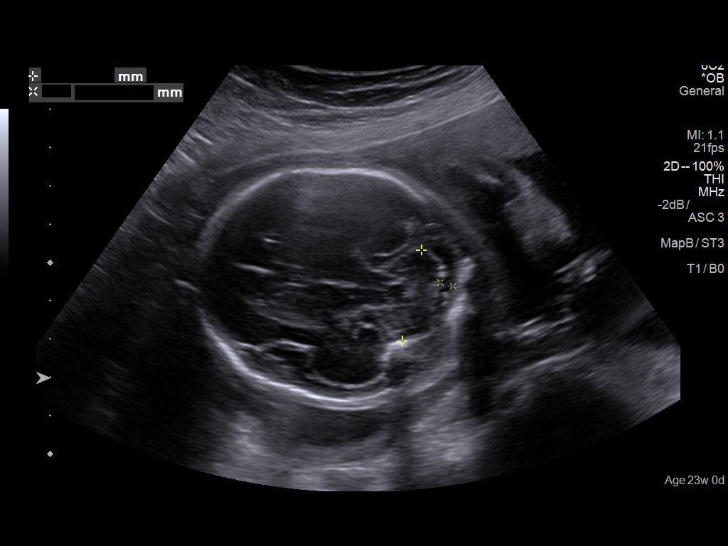
[im 34/77]
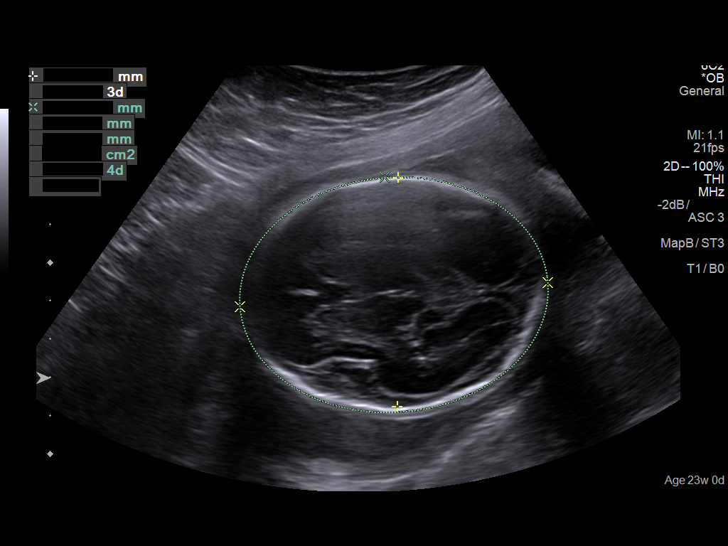
[im 43/77]
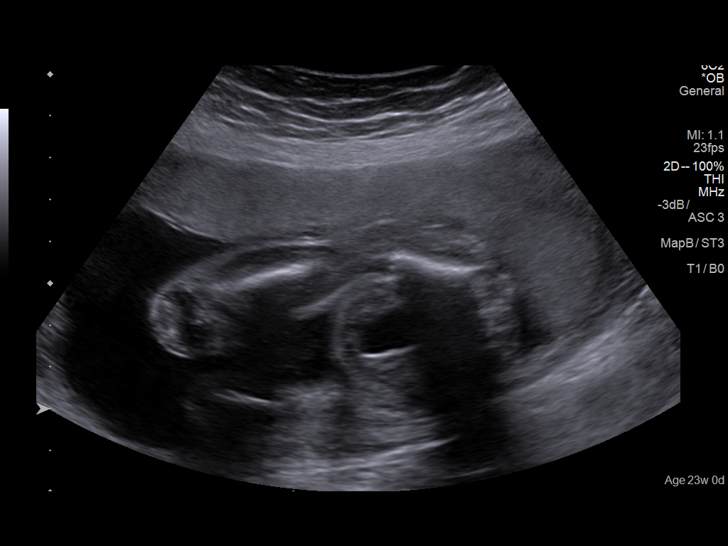
[im 48/77]
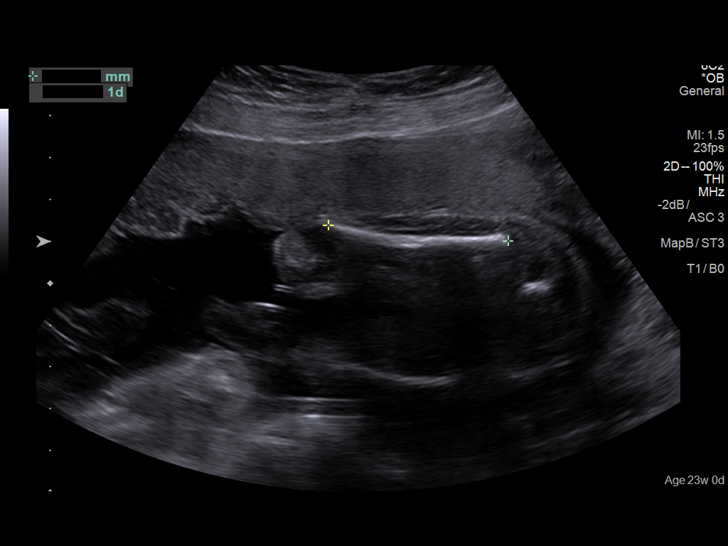
[im 54/77]
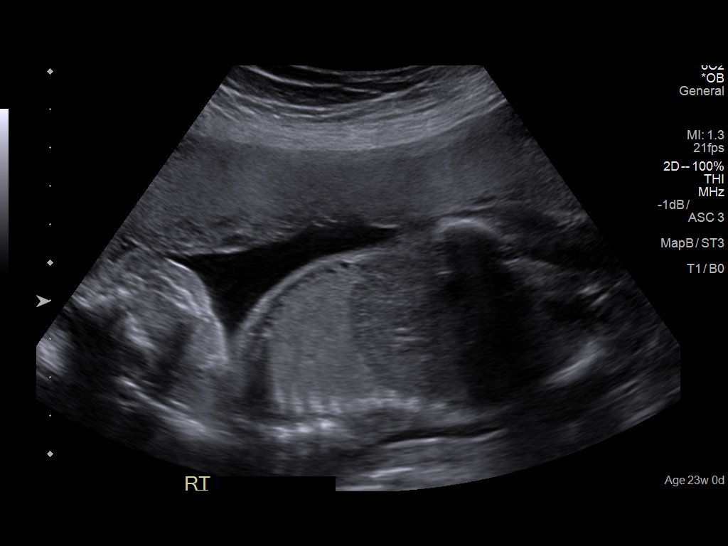
[im 62/77]
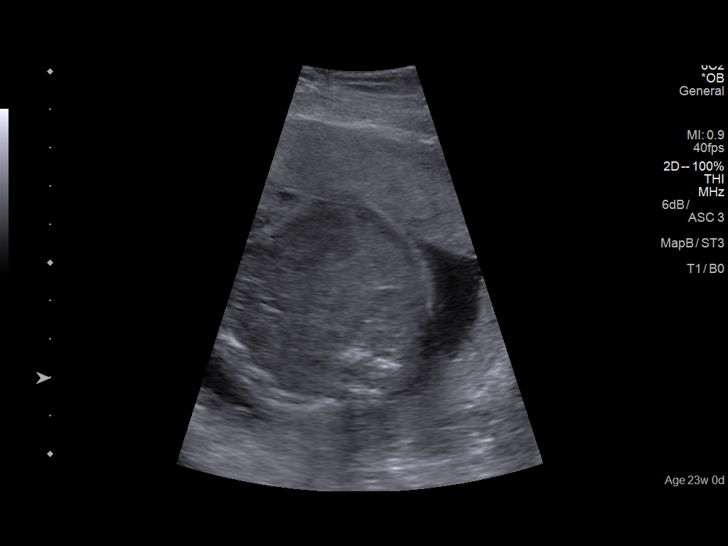
[im 68/77]
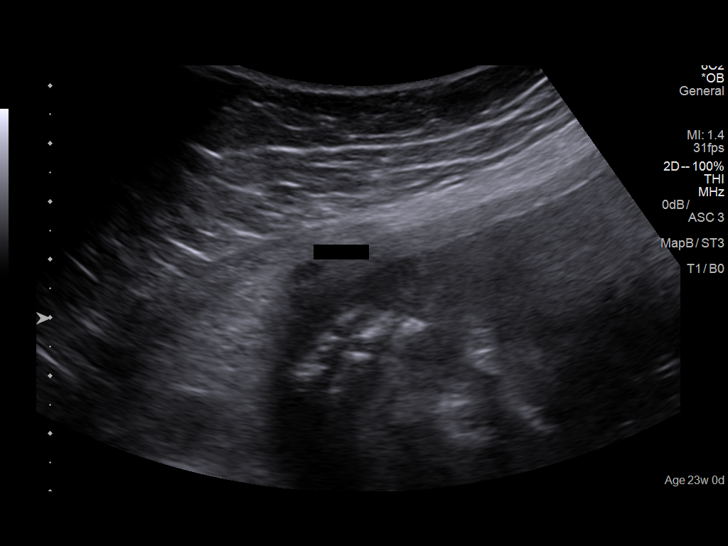
[im 74/77]
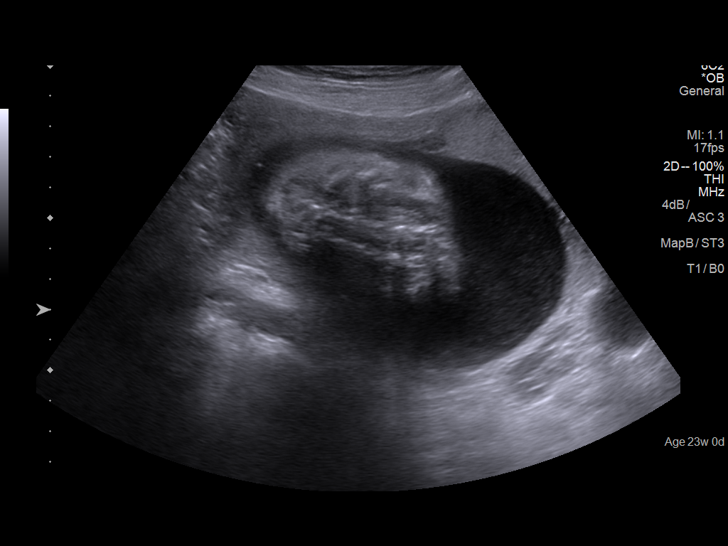

[12 of 28 positions shown; findings below may reference images not displayed]

PATIENT INFO:

PERFORMED BY:

SERVICE(S) PROVIDED:

INDICATIONS:

23 weeks gestation of pregnancy
Advanced maternal age
History of FGR
History of prior c-section
FETAL EVALUATION:

Num Of Fetuses:     1
Fetal Heart         144
Rate(bpm):
Cardiac Activity:   Present
Presentation:       Variable
Placenta:           Anterior, No previa

Amniotic Fluid
AFI FV:      Within normal limits
BIOMETRY:

BPD:      60.3  mm     G. Age:  24w 4d         92  %    CI:        72.92   %    70 - 86
FL/HC:       19.2  %    19.2 -
HC:      224.5  mm     G. Age:  24w 3d         87  %    HC/AC:       1.11       1.05 -
AC:      202.5  mm     G. Age:  24w 6d         91  %    FL/BPD:      71.6  %    71 - 87
FL:       43.2  mm     G. Age:  24w 1d         76  %    FL/AC:       21.3  %    20 - 24
HUM:      40.7  mm     G. Age:  24w 5d         80  %

Est. FW:     709   gm     1 lb 9 oz     97  %
GESTATIONAL AGE:

LMP:           23w 0d        Date:  08/21/15                 EDD:   05/27/16
U/S Today:     24w 4d                                        EDD:   05/16/16
Best:          23w 0d     Det. By:  LMP  (08/21/15)          EDD:   05/27/16
ANATOMY:

Cavum:                 CSP visualized         Aortic Arch:            Normal appearance
Ventricles:            Normal appearance      Ductal Arch:            Normal appearance
Choroid Plexus:        Within Normal Limits   Diaphragm:              Within Normal Limits
Cerebellum:            Within Normal Limits   Stomach:                Seen
Posterior Fossa:       Within Normal Limits   Abdomen:                Within Normal
Limits
Nuchal Fold:           Within Normal Limits   Abdominal Wall:         Normal appearance
Face:                  Orbits visualized      Cord Vessels:           3 vessels
Lips:                  Normal appearance      Kidneys:                Normal appearance
Thoracic:              Within Normal Limits   Bladder:                Seen
Heart:                 4-Chamber view         Spine:                  Limited views due
appears normal                                 to position
RVOT:                  Normal appearance      Upper Extremities:      Visualized
LVOT:                  Normal appearance      Lower Extremities:      Visualized
CERVIX UTERUS ADNEXA:

Cervix
Length:           3.22  cm.
RECOMMENDATIONS:

Single intrauterine pregnancy with a best estimated
gestational age of 23 weeks 0 days.  Dating is based on LMP
consistent with earliest available ultrasound performed at
[facility] on 10/25/15; measurements were
reported as 9 weeks 6 days.

Detailed ultrasound performed for the indication of advanced
maternal age.

Ms. Kazlas previously had negative cell free fetal DNA screen
results and negative msAFP.

Due to fetal position, sagittal views of the distal spine were
suboptimally seen; transverse views appear normal.  Other
fetal anatomy visualized appears normal.

Findings were discussed.  We would be more than happy to
see Ms. Mofidul Tiger to complete evaluation of the fetal spine;
alternatively, this may be reevaluated at the time of the next
growth ultrasound.

Recommend serial ultrasounds for growth after 28 weeks
given history of fetal growth restriction.

## 2018-07-01 ENCOUNTER — Emergency Department
Admission: EM | Admit: 2018-07-01 | Discharge: 2018-07-01 | Disposition: A | Discharge status: Home / Self Care | Payer: BC Managed Care – PPO | Attending: Emergency Medicine | Admitting: Emergency Medicine

## 2018-07-01 ENCOUNTER — Other Ambulatory Visit: Payer: Self-pay

## 2018-07-01 ENCOUNTER — Emergency Department: Payer: BC Managed Care – PPO

## 2018-07-01 DIAGNOSIS — R079 Chest pain, unspecified: Secondary | ICD-10-CM | POA: Diagnosis present

## 2018-07-01 DIAGNOSIS — F411 Generalized anxiety disorder: Secondary | ICD-10-CM | POA: Insufficient documentation

## 2018-07-01 DIAGNOSIS — Z79899 Other long term (current) drug therapy: Secondary | ICD-10-CM | POA: Insufficient documentation

## 2018-07-01 DIAGNOSIS — J45909 Unspecified asthma, uncomplicated: Secondary | ICD-10-CM | POA: Insufficient documentation

## 2018-07-01 DIAGNOSIS — F410 Panic disorder [episodic paroxysmal anxiety] without agoraphobia: Secondary | ICD-10-CM

## 2018-07-01 DIAGNOSIS — F419 Anxiety disorder, unspecified: Secondary | ICD-10-CM

## 2018-07-01 LAB — CBC
HCT: 41.9 % (ref 36.0–46.0)
Hemoglobin: 14.8 g/dL (ref 12.0–15.0)
MCH: 32.2 pg (ref 26.0–34.0)
MCHC: 35.3 g/dL (ref 30.0–36.0)
MCV: 91.1 fL (ref 80.0–100.0)
Platelets: 272 10*3/uL (ref 150–400)
RBC: 4.6 MIL/uL (ref 3.87–5.11)
RDW: 11.2 % — ABNORMAL LOW (ref 11.5–15.5)
WBC: 8.1 10*3/uL (ref 4.0–10.5)
nRBC: 0 % (ref 0.0–0.2)

## 2018-07-01 LAB — BASIC METABOLIC PANEL
Anion gap: 11 (ref 5–15)
BUN: 12 mg/dL (ref 6–20)
CO2: 20 mmol/L — ABNORMAL LOW (ref 22–32)
Calcium: 9.5 mg/dL (ref 8.9–10.3)
Chloride: 106 mmol/L (ref 98–111)
Creatinine, Ser: 0.7 mg/dL (ref 0.44–1.00)
GFR calc Af Amer: >60 mL/min (ref >60)
GFR calc non Af Amer: >60 mL/min (ref >60)
Glucose, Bld: 109 mg/dL — ABNORMAL HIGH (ref 70–99)
Potassium: 3.3 mmol/L — ABNORMAL LOW (ref 3.5–5.1)
Sodium: 137 mmol/L (ref 135–145)

## 2018-07-01 LAB — POCT PREGNANCY, URINE: Preg Test, Ur: NEGATIVE

## 2018-07-01 LAB — TROPONIN I (HIGH SENSITIVITY): Troponin I (High Sensitivity): <2 ng/L (ref <18)

## 2018-07-01 MED ORDER — TRAZODONE HCL 50 MG PO TABS: 50.0000 mg | ORAL_TABLET | Freq: Every day | ORAL | Status: DC

## 2018-07-01 MED ORDER — SERTRALINE HCL 50 MG PO TABS: 25.0000 mg | ORAL_TABLET | Freq: Every day | ORAL | Status: DC

## 2018-07-01 MED ORDER — TRAZODONE HCL 50 MG PO TABS: 50.0000 mg | ORAL_TABLET | Freq: Every day | ORAL | 0 refills | Status: AC

## 2018-07-01 MED ORDER — LORAZEPAM 2 MG PO TABS
2.0000 mg | ORAL_TABLET | Freq: Once | ORAL | Status: AC
  Administered 2018-07-01: 2 mg via ORAL
  Filled 2018-07-01: qty 1

## 2018-07-01 MED ORDER — SERTRALINE HCL 25 MG PO TABS: 25.0000 mg | ORAL_TABLET | Freq: Every day | ORAL | 0 refills | Status: AC

## 2018-07-01 MED ORDER — SODIUM CHLORIDE 0.9% FLUSH: 3.0000 mL | Freq: Once | INTRAVENOUS | Status: DC

## 2018-07-01 NOTE — ED Notes (Signed)
Pt transported to Xray. 

## 2018-07-01 NOTE — Consult Note (Signed)
[facility] Consult   Reason for Consult:  Anxiety, panic symptoms Referring Physician:  Dr.McShane Patient Identification: Sydney Lynn MRN:  [id number] Principal Diagnosis: Generalized anxiety disorder Diagnosis:  Active Problems:   * No active hospital problems. *   Total Time spent with patient: 30 minutes  Subjective:   Sydney Lynn is a 43 y.o. female patient admitted with chest pain, palpitations and significant anxiety.  HPI: Patient Is a 43 year old Caucasian female who comes in with chest pain,palpitations and significant anxiety.  She reports that that she has been extremely worried about her contracting COVID virus and has been not doing well for the past couple weeks.  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  She has 2 small children with her fianc  States doing  well in her personal life.  She was due to move to a house  along with her parents which was supposedly a good move.  However states that with all the other anxieties and the current covid situation she got very overwhelmed and started freaking out.  She denies any previous history of anxiety.  Denies any previous depression.  She has never seen a psychiatrist or a therapist previously.  States that her anxiety has been building up significantly and it got worse more recently.  She admits to having poor sleep mostly and that that got worse recently.  She also has not been eating anything for the last few days.  She denies use of any drugs or alcohol.  Denies any psychotic symptoms.  She denies any suicidal or homicidal thoughts.   Past Psychiatric History: No significant history, reports some anxiety 10 years ago.  Risk to Self:  none Risk to Others:  none Prior Inpatient Therapy:  none Prior Outpatient Therapy:  none  Past Medical History:  Past Medical History:  Diagnosis Date  . Asthma    Exercise Induced  . Complication of anesthesia    ITCHING   . H/O spina  bifida    Type 1  . HSV-2 infection   . Hypertension    WITH PREGNANCY ONLY-NORMALIZED AFTER DELIVERY    Past Surgical History:  Procedure Laterality Date  . CESAREAN SECTION N/A 01/27/2015   Procedure: CESAREAN SECTION;  Surgeon: Rubie Maid, MD;  Location: [facility];  Service: Obstetrics;  Laterality: N/A;  . CESAREAN SECTION N/A 04/21/2016   Procedure: CESAREAN SECTION;  Surgeon: Ouida Sills Gwen Her, MD;  Location: [facility];  Service: Obstetrics;  Laterality: N/A;  . LAPAROSCOPIC LYSIS OF ADHESIONS  10/04/2016   Procedure: LAPAROSCOPIC LYSIS OF ADHESIONS;  Surgeon: Ouida Sills, Gwen Her, MD;  Location: [facility];  Service: Gynecology;;  . LAPAROSCOPIC TUBAL LIGATION Bilateral 10/04/2016   Procedure: LAPAROSCOPIC TUBAL LIGATION;  Surgeon: Schermerhorn, Gwen Her, MD;  Location: [facility];  Service: Gynecology;  Laterality: Bilateral;  . NASAL SINUS SURGERY    . NASAL SINUS SURGERY     Family History:  Family History  Problem Relation Age of Onset  . Hypertension Mother   . Spina bifida Father        mild  . Spina bifida Brother        mild  . Breast cancer Neg Hx    Family Psychiatric  History: none Social History:  Social History   Substance and Sexual Activity  Alcohol Use No     Social History   Substance and Sexual Activity  Drug Use No    Social History   Socioeconomic  History  . Marital status: Single    Spouse name: Not on file  . Number of children: Not on file  . Years of education: Not on file  . Highest education level: Not on file  Occupational History  . Not on file  Social Needs  . Financial resource strain: Not on file  . Food insecurity    Worry: Not on file    Inability: Not on file  . Transportation needs    Medical: Not on file    Non-medical: Not on file  Tobacco Use  . Smoking status: Never Smoker  . Smokeless tobacco: Never Used  Substance and Sexual Activity  . Alcohol use: No  . Drug use: No  . Sexual activity: Yes    Birth  control/protection: None  Lifestyle  . Physical activity    Days per week: Not on file    Minutes per session: Not on file  . Stress: Not on file  Relationships  . Social Musicianconnections    Talks on phone: Not on file    Gets together: Not on file    Attends religious service: Not on file    Active member of club or organization: Not on file    Attends meetings of clubs or organizations: Not on file    Relationship status: Not on file  Other Topics Concern  . Not on file  Social History Narrative  . 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 Amoxicillin Hives  . Bee Venom     Labs:  Results for orders placed or performed during the hospital encounter of 07/01/18 (from the past 48 hour(s))  Basic metabolic panel     Status: Abnormal   Collection Time: 07/01/18  9:22 AM  Result Value Ref Range   Sodium 137 135 - 145 mmol/L   Potassium 3.3 (L) 3.5 - 5.1 mmol/L   Chloride 106 98 - 111 mmol/L   CO2 20 (L) 22 - 32 mmol/L   Glucose, Bld 109 (H) 70 - 99 mg/dL   BUN 12 6 - 20 mg/dL   Creatinine, Ser 5.780.70 0.44 - 1.00 mg/dL   Calcium 9.5 8.9 - 46.910.3 mg/dL   GFR calc non Af Amer >60 >60 mL/min   GFR calc Af Amer >60 >60 mL/min   Anion gap 11 5 - 15    Comment: Performed at [facility], [address] [place], [place] [address]  CBC     Status: Abnormal   Collection Time: 07/01/18  9:22 AM  Result Value Ref Range   WBC 8.1 4.0 - 10.5 K/uL   RBC 4.60 3.87 - 5.11 MIL/uL   Hemoglobin 14.8 12.0 - 15.0 g/dL   HCT 84.141.9 32.436.0 - 40.146.0 %   MCV 91.1 80.0 - 100.0 fL   MCH 32.2 26.0 - 34.0 pg   MCHC 35.3 30.0 - 36.0 g/dL   RDW 02.711.2 (L) 25.311.5 - 66.415.5 %   Platelets 272 150 - 400 K/uL   nRBC 0.0 0.0 - 0.2 %    Comment: Performed at [facility], [address] [place], [place] [address]  Troponin I (High Sensitivity)     Status: None   Collection Time: 07/01/18  9:22 AM  Result  Value Ref Range   Troponin I (High Sensitivity) <2 <18 ng/L    Comment: (NOTE) Elevated high sensitivity troponin I (hsTnI) values  and significant  changes across serial measurements may suggest ACS but many other  chronic and acute conditions are known to elevate hsTnI results.  Refer to the "Links" section for chest pain algorithms and additional  guidance. Performed at [facility], [address] [place], [place] [address]   Pregnancy, urine POC     Status: None   Collection Time: 07/01/18  9:22 AM  Result Value Ref Range   Preg Test, Ur NEGATIVE NEGATIVE    Comment:        THE SENSITIVITY OF THIS METHODOLOGY IS >24 mIU/mL     No current facility-administered medications for this encounter.    Current Outpatient Medications  Medication Sig Dispense Refill  . albuterol (PROVENTIL HFA;VENTOLIN HFA) 108 (90 Base) MCG/ACT inhaler Inhale 1-2 puffs into the lungs every 4 (four) hours as needed for wheezing or shortness of breath.    . cetirizine (ZYRTEC) 5 MG tablet Take 5 mg by mouth daily.    Marland Kitchen. doxylamine, Sleep, (UNISOM) 25 MG tablet Take 25 mg by mouth at bedtime as needed.    Marland Kitchen. EPINEPHrine (EPIPEN 2-PAK) 0.3 mg/0.3 mL IJ SOAJ injection Inject 0.3 mLs (0.3 mg total) into the muscle once. 1 Device 3  . naproxen (NAPROSYN) 500 MG tablet Take 1 tablet (500 mg total) by mouth 2 (two) times daily with a meal. 60 tablet 0  . triamcinolone (NASACORT) 55 MCG/ACT nasal inhaler Place 2 sprays into both nostrils daily.     . valACYclovir (VALTREX) 500 MG tablet Take 125 mg by mouth 2 (two) times daily as needed.       Musculoskeletal: Strength & Muscle Tone: within normal limits Gait & Station: normal Patient leans: N/A  Psychiatric Specialty Exam: Physical Exam  ROS  Blood pressure 137/71, pulse 84, temperature 97.8 F (36.6 C), temperature source Oral, resp. rate (!) 24, height 5\' 4"  (1.626 m), weight 79.4 kg, last menstrual period 06/22/2018, SpO2 100 %.Body mass index  is 30.04 kg/m.  General Appearance: Casual  Eye Contact:  Fair  Speech:  Clear and Coherent  Volume:  Normal  Mood:  Anxious and Dysphoric  Affect:  Constricted  Thought Process:  Coherent  Orientation:  Full (Time, Place, and Person)  Thought Content:  Logical  Suicidal Thoughts:  No  Homicidal Thoughts:  No  Memory:  Immediate;   Fair Recent;   Fair Remote;   Fair  Judgement:  Fair  Insight:  Fair  Psychomotor Activity:  Normal  Concentration:  Concentration: Fair and Attention Span: Fair  Recall:  FiservFair  Fund of Knowledge:  Fair  Language:  Fair  Akathisia:  No  Handed:  Right  AIMS (if indicated):     Assets:  Communication Skills Desire for Improvement Financial Resources/Insurance Housing Physical Health Resilience Social Support Vocational/Educational  ADL's:  Intact  Cognition:  WNL  Sleep:   poor     Treatment Plan Summary: Patient is  A 43 yo woman presenting with significant anxiety and panic attacks related to stress from corona virus and personal stressors secondary to anticipated move.  Start zoloft at 25mg  po qd in the morning, side effects and benefits discussed. Start trazodone at 50mg  at bedtime. Patient to follow up with outpatient psychiatrist, resource list will be given by triage specialist. Patient recommended to see a therapist via internet, recommend looking up [facility] and [employer] which provide therapy online. Discussed plan and disposition with Dr.McShane, ER attending.    Disposition: Patient does not meet criteria for psychiatric  inpatient admission. Supportive therapy provided about ongoing stressors. Discussed crisis plan, support from social network, calling 911, coming to the Emergency Department, and calling Suicide Hotline.  Patrick NorthHimabindu Tyus Kallam, MD 07/01/2018 1:48 PM

## 2018-07-01 NOTE — Discharge Instructions (Signed)
Return to the emergency room for any chest pain, shortness of breath, thoughts of hurting yourself or others or any other concerns.  Follow closely with the referral information given to by psychiatry of those listed above.

## 2018-07-01 NOTE — ED Triage Notes (Signed)
Pt c/o chest tightness and SOB today , tingling all over.. pt states she thinks she is having a panic attack, states she has been stressed about the covid issues and now they have been discussing having to move and feels she is just stressed out. Pt is anxious and tachypneic on arrival.

## 2018-07-01 NOTE — ED Provider Notes (Addendum)
[facility] Emergency Department Provider Note  ____________________________________________   I have reviewed the triage vital signs and the nursing notes. Where available I have reviewed prior notes and, if possible and indicated, outside hospital notes.    HISTORY  Chief Complaint Chest Pain  Patient seen and evaluated during the coronavirus epidemic during a time with low staffing  HPI Sydney Lynn is a 43 y.o. female who states he has a history of anxiety, and states she has been under good deal of anxiety.  She states that she has no SI no HI she denies physical abuse.  She states that her thoughts are racing, she has many life stressors including a move, coronavirus etc.  She has no symptoms of any organic pathology that she knows of she just feels very anxious.  She states when she feels anxious she has panic attacks which involve overwhelming anxiety.   Past Medical History:  Diagnosis Date  . Asthma    Exercise Induced  . Complication of anesthesia    ITCHING   . H/O spina bifida    Type 1  . HSV-2 infection   . Hypertension    WITH PREGNANCY ONLY-NORMALIZED AFTER DELIVERY    Patient Active Problem List   Diagnosis Date Noted  . Maternal care for fetal decelerations during pregnancy 04/21/2016  . Postoperative state 04/21/2016  . Proteinuria affecting pregnancy 04/19/2016  . NST (non-stress test) reactive 04/01/2016  . Previous cesarean delivery affecting pregnancy 11/20/2015  . History of prior pregnancy with IUGR newborn 11/20/2015  . Advanced maternal age in multigravida 10/10/2015  . H/O intrinsic asthma 10/10/2015  . History of pre-eclampsia in prior pregnancy, currently pregnant in second trimester 01/26/2015  . 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 Rh negative state in antepartum period 10/25/2014  . H/O spina bifida 07/28/2014  . Low back pain associated with a spinal disorder other than radiculopathy or spinal  stenosis 12/17/2013  . Lateral pain of left hip 09/26/2011  . PATELLO-FEMORAL SYNDROME 08/17/2008  . UNEQUAL LEG LENGTH 08/17/2008  . ABNORMALITY OF GAIT 08/17/2008    Past Surgical History:  Procedure Laterality Date  . CESAREAN SECTION N/A 01/27/2015   Procedure: CESAREAN SECTION;  Surgeon: Hildred LaserAnika Cherry, MD;  Location: [facility] ORS;  Service: Obstetrics;  Laterality: N/A;  . CESAREAN SECTION N/A 04/21/2016   Procedure: CESAREAN SECTION;  Surgeon: Feliberto GottronSchermerhorn, Ihor Austinhomas J, MD;  Location: [facility] ORS;  Service: Obstetrics;  Laterality: N/A;  . LAPAROSCOPIC LYSIS OF ADHESIONS  10/04/2016   Procedure: LAPAROSCOPIC LYSIS OF ADHESIONS;  Surgeon: Feliberto GottronSchermerhorn, Ihor Austinhomas J, MD;  Location: [facility] ORS;  Service: Gynecology;;  . LAPAROSCOPIC TUBAL LIGATION Bilateral 10/04/2016   Procedure: LAPAROSCOPIC TUBAL LIGATION;  Surgeon: Schermerhorn, Ihor Austinhomas J, MD;  Location: [facility] ORS;  Service: Gynecology;  Laterality: Bilateral;  . NASAL SINUS SURGERY    . NASAL SINUS SURGERY      Prior to Admission medications   Medication Sig Start Date End Date Taking? Authorizing Provider  albuterol (PROVENTIL HFA;VENTOLIN HFA) 108 (90 Base) MCG/ACT inhaler Inhale 1-2 puffs into the lungs every 4 (four) hours as needed for wheezing or shortness of breath.    [provider]  cetirizine (ZYRTEC) 5 MG tablet Take 5 mg by mouth daily.    [provider]  doxylamine, Sleep, (UNISOM) 25 MG tablet Take 25 mg by mouth at bedtime as needed.    [provider]  EPINEPHrine (EPIPEN 2-PAK) 0.3 mg/0.3 mL IJ SOAJ injection Inject 0.3 mLs (0.3  mg total) into the muscle once. 08/02/14   Rubie Maid, MD  naproxen (NAPROSYN) 500 MG tablet Take 1 tablet (500 mg total) by mouth 2 (two) times daily with a meal. 06/03/17   Lorin Picket, PA-C  triamcinolone (NASACORT) 55 MCG/ACT nasal inhaler Place 2 sprays into both nostrils daily.  09/12/11   [provider]  valACYclovir (VALTREX) 500 MG tablet Take 125 mg by mouth  2 (two) times daily as needed.     [provider]    Allergies Amoxicillin and Bee venom  Family History  Problem Relation Age of Onset  . Hypertension Mother   . Spina bifida Father        mild  . Spina bifida Brother        mild  . Breast cancer Neg Hx     Social History Social History   Tobacco Use  . Smoking status: Never Smoker  . Smokeless tobacco: Never Used  Substance Use Topics  . Alcohol use: No  . Drug use: No    Review of Systems Constitutional: No fever/chills Eyes: No visual changes. ENT: No sore throat. No stiff neck no neck pain Cardiovascular: Denies chest pain. Respiratory: Denies shortness of breath. Gastrointestinal:   no vomiting.  No diarrhea.  No constipation. Genitourinary: Negative for dysuria. Musculoskeletal: Negative lower extremity swelling Skin: Negative for rash. Neurological: Negative for severe headaches, focal weakness or numbness.   ____________________________________________   PHYSICAL EXAM:  VITAL SIGNS: ED Triage Vitals  Enc Vitals Group     BP 07/01/18 0918 137/71     Pulse Rate 07/01/18 0918 84     Resp 07/01/18 0918 (!) 24     Temp 07/01/18 0918 97.8 F (36.6 C)     Temp Source 07/01/18 0918 Oral     SpO2 07/01/18 0918 100 %     Weight 07/01/18 0915 175 lb (79.4 kg)     Height 07/01/18 0915 5\' 4"  (1.626 m)     Head Circumference --      Peak Flow --      Pain Score 07/01/18 0915 10     Pain Loc --      Pain Edu? --      Excl. in [place]? --     Constitutional: Alert and oriented.  She is nontoxic but she is very very anxious holding her knees and looking quite anxious Eyes: Conjunctivae are normal Head: Atraumatic HEENT: No congestion/rhinnorhea. Mucous membranes are moist.  Oropharynx non-erythematous Neck:   Nontender with no meningismus, no masses, no stridor Cardiovascular: Normal rate, regular rhythm. Grossly normal heart sounds.  Good peripheral circulation. Respiratory: Normal respiratory  effort.  No retractions. Lungs CTAB. Abdominal: Soft and nontender. No distention. No guarding no rebound Back:  There is no focal tenderness or step off.  there is no midline tenderness there are no lesions noted. there is no CVA tenderness Musculoskeletal: No lower extremity tenderness, no upper extremity tenderness. No joint effusions, no DVT signs strong distal pulses no edema Neurologic:  Normal speech and language. No gross focal neurologic deficits are appreciated.  Skin:  Skin is warm, dry and intact. No rash noted. Psychiatric: Mood and affect are anxious.  ____________________________________________   LABS (all labs ordered are listed, but only abnormal results are displayed)  Labs Reviewed  BASIC METABOLIC PANEL - Abnormal; Notable for the following components:      Result Value   Potassium 3.3 (*)    CO2 20 (*)    Glucose,  Bld 109 (*)    All other components within normal limits  CBC - Abnormal; Notable for the following components:   RDW 11.2 (*)    All other components within normal limits  TROPONIN I (HIGH SENSITIVITY)  TROPONIN I (HIGH SENSITIVITY)  POC URINE PREG, ED  POCT PREGNANCY, URINE    Pertinent labs  results that were available during my care of the patient were reviewed by me and considered in my medical decision making (see chart for details). ____________________________________________  EKG  I personally interpreted any EKGs ordered by me or triage Normal sinus rhythm rate 80 bpm normal axis, nonspecific ST changes, likely secondary to strain pattern, no acute ischemia ____________________________________________  RADIOLOGY  Pertinent labs & imaging results that were available during my care of the patient were reviewed by me and considered in my medical decision making (see chart for details). If possible, patient and/or family made aware of any abnormal findings.  Dg Chest 2 View  Result Date: 07/01/2018 CLINICAL DATA:  Central chest pain  since last night EXAM: CHEST - 2 VIEW COMPARISON:  None. FINDINGS: The heart size and mediastinal contours are within normal limits. Both lungs are clear. The visualized skeletal structures are unremarkable. IMPRESSION: Negative chest Electronically Signed   By: Marnee SpringJonathon  Watts M.D.   On: 07/01/2018 10:28   ____________________________________________    PROCEDURES  Procedure(s) performed: None  Procedures  Critical Care performed: None  ____________________________________________   INITIAL IMPRESSION / ASSESSMENT AND PLAN / ED COURSE  Pertinent labs & imaging results that were available during my care of the patient were reviewed by me and considered in my medical decision making (see chart for details).  Here with several days of anxiety, no old EKG for comparison but troponin is negative despite symptoms for several days.  No SI no HI, we will give her anxiolytic here she does have a ride home we will have psychiatry evaluate her.  Low suspicion for ACS PE dissection myocarditis endocarditis pneumonia pneumothorax or any other intrathoracic pathology.  Her chief complaint is that life stressors are too much for her.  ----------------------------------------- 12:47 PM on 07/01/2018 -----------------------------------------  Continue without SI or HI, much more comfortable and calm after the Ativan, states that we "broke" her panic attack.  Awaiting psychiatric input  ----------------------------------------- 2:35 PM on 07/01/2018 -----------------------------------------  Seen and evaluated by psychiatry, appreciate consults, they did request that I start her on Zoloft, 25 daily as well as trazodone 25 nightly which we have done for a month, patient will have outpatient follow-up.  No SI no HI, feels 100% better, return precautions follow-up given and understood.      ____________________________________________   FINAL CLINICAL IMPRESSION(S) / ED DIAGNOSES  Final  diagnoses:  None      This chart was dictated using voice recognition software.  Despite best efforts to proofread,  errors can occur which can change meaning.      Jeanmarie PlantMcShane, Brave Dack A, MD 07/01/18 1105    Jeanmarie PlantMcShane, Anuar Walgren A, MD 07/01/18 1247    Jeanmarie PlantMcShane, Lajuane Leatham A, MD 07/01/18 [phone]

## 2018-07-01 NOTE — ED Notes (Signed)
Send green, purple tubes,and a urine specimen to the lab.

## 2018-07-01 NOTE — ED Notes (Signed)
Pt presents to ED, notably anxious at this time. Pt noted to be holding her head and crying at this time, rocking back and forth while sitting in 19H. Pt reports mulitple external stressors at this time, including Covid19, buying a new house, and moving. Pt reports last night that she began having CP, HA, and SOB and intermittent crying, reports panic attacks lasting approx 15mins intermittently since last night. Pt denies previous psych hx, denies SI/HI at this time.

## 2018-07-01 NOTE — ED Notes (Signed)
Pt encouraged to lay back and get comfortable in bed, noted to be much calmer than previously assessed. Will continue to monitor for further patient needs.

## 2018-07-01 NOTE — ED Notes (Signed)
Pt discharged to home.  Verbalized understanding  Of discharge instructions and follow up care.

## 2018-07-02 ENCOUNTER — Other Ambulatory Visit: Payer: Self-pay

## 2018-07-02 ENCOUNTER — Emergency Department
Admission: EM | Admit: 2018-07-02 | Discharge: 2018-07-02 | Disposition: A | Discharge status: Home / Self Care | Payer: BC Managed Care – PPO | Attending: Emergency Medicine | Admitting: Emergency Medicine

## 2018-07-02 ENCOUNTER — Encounter: Payer: Self-pay | Admitting: Emergency Medicine

## 2018-07-02 DIAGNOSIS — J45909 Unspecified asthma, uncomplicated: Secondary | ICD-10-CM | POA: Diagnosis not present

## 2018-07-02 DIAGNOSIS — Z79899 Other long term (current) drug therapy: Secondary | ICD-10-CM | POA: Diagnosis not present

## 2018-07-02 DIAGNOSIS — F419 Anxiety disorder, unspecified: Secondary | ICD-10-CM | POA: Diagnosis not present

## 2018-07-02 DIAGNOSIS — I10 Essential (primary) hypertension: Secondary | ICD-10-CM | POA: Diagnosis not present

## 2018-07-02 MED ORDER — HYDROXYZINE HCL 25 MG PO TABS
25.0000 mg | ORAL_TABLET | Freq: Once | ORAL | Status: AC
  Administered 2018-07-02: 25 mg via ORAL
  Filled 2018-07-02: qty 1

## 2018-07-02 MED ORDER — HYDROXYZINE HCL 25 MG PO TABS: 25.0000 mg | ORAL_TABLET | Freq: Three times a day (TID) | ORAL | 0 refills | Status: AC | PRN

## 2018-07-02 MED ORDER — LORAZEPAM 0.5 MG PO TABS: 0.5000 mg | ORAL_TABLET | Freq: Three times a day (TID) | ORAL | 0 refills | Status: AC | PRN

## 2018-07-02 MED ORDER — LORAZEPAM 1 MG PO TABS
1.0000 mg | ORAL_TABLET | Freq: Once | ORAL | Status: AC
  Administered 2018-07-02: 11:00:00 1 mg via ORAL
  Filled 2018-07-02: qty 1

## 2018-07-02 NOTE — ED Notes (Signed)
Patient assigned to appropriate care area   Introduced self to pt  Patient oriented to unit/care area: Informed that, for their safety, care areas are designed for safety and visiting and phone hours explained to patient. Patient verbalizes understanding, and verbal contract for safety obtained  Environment secured   Patient said she's feeling short of breath, almost like she is having a panic attack

## 2018-07-02 NOTE — ED Triage Notes (Signed)
Pt presents to ED via POV with c/o continued anxiety. Pt states feels no better than she did yesterday, states continued anxiety. Pt seen yesterday for same. Pt appears somewhat calm in triage, texting on her phone. Pt states she took her meds as prescribed but they did not help. This RN spoke with Dr. Kerman Passey, given that patient seen yesterday for same and c/o continued anxiety, no bloodwork in triage. Pt denies SI/HI.

## 2018-07-02 NOTE — ED Provider Notes (Signed)
[facility] Emergency Department Provider Note  Time seen: 10:41 AM  I have reviewed the triage vital signs and the nursing notes.   HISTORY  Chief Complaint Anxiety   HPI Sydney Lynn is a 43 y.o. female with a past medical history of asthma, presents to the emergency department with symptoms of panic/anxiety.  According to the patient and record review patient was seen in the emergency department yesterday  for anxiety, was seen by psychiatry and started on Zoloft as well as trazodone.  Patient states since going home she has continued to feel nauseous, continues to feel a sensation of anxiety and panic.  Patient does admit to significant anxiety which has been exacerbated by coronavirus.  Patient denies any chest pain, no fever cough or congestion.  No abdominal pain.  Has not vomited but continues to feel nauseated.   Past Medical History:  Diagnosis Date  . Asthma    Exercise Induced  . Complication of anesthesia    ITCHING   . H/O spina bifida    Type 1  . HSV-2 infection   . Hypertension    WITH PREGNANCY ONLY-NORMALIZED AFTER DELIVERY    Patient Active Problem List   Diagnosis Date Noted  . Maternal care for fetal decelerations during pregnancy 04/21/2016  . Postoperative state 04/21/2016  . Proteinuria affecting pregnancy 04/19/2016  . NST (non-stress test) reactive 04/01/2016  . Previous cesarean delivery affecting pregnancy 11/20/2015  . History of prior pregnancy with IUGR newborn 11/20/2015  . Advanced maternal age in multigravida 10/10/2015  . H/O intrinsic asthma 10/10/2015  . History of pre-eclampsia in prior pregnancy, currently pregnant in second trimester 01/26/2015  . 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 Rh negative state in antepartum period 10/25/2014  . H/O spina bifida 07/28/2014  . Low back pain associated with a spinal disorder other than radiculopathy or spinal stenosis 12/17/2013  . Lateral pain of left  hip 09/26/2011  . PATELLO-FEMORAL SYNDROME 08/17/2008  . UNEQUAL LEG LENGTH 08/17/2008  . ABNORMALITY OF GAIT 08/17/2008    Past Surgical History:  Procedure Laterality Date  . CESAREAN SECTION N/A 01/27/2015   Procedure: CESAREAN SECTION;  Surgeon: Hildred LaserAnika Cherry, MD;  Location: [facility] ORS;  Service: Obstetrics;  Laterality: N/A;  . CESAREAN SECTION N/A 04/21/2016   Procedure: CESAREAN SECTION;  Surgeon: Feliberto GottronSchermerhorn, Ihor Austinhomas J, MD;  Location: [facility] ORS;  Service: Obstetrics;  Laterality: N/A;  . LAPAROSCOPIC LYSIS OF ADHESIONS  10/04/2016   Procedure: LAPAROSCOPIC LYSIS OF ADHESIONS;  Surgeon: Feliberto GottronSchermerhorn, Ihor Austinhomas J, MD;  Location: [facility] ORS;  Service: Gynecology;;  . LAPAROSCOPIC TUBAL LIGATION Bilateral 10/04/2016   Procedure: LAPAROSCOPIC TUBAL LIGATION;  Surgeon: Schermerhorn, Ihor Austinhomas J, MD;  Location: [facility] ORS;  Service: Gynecology;  Laterality: Bilateral;  . NASAL SINUS SURGERY    . NASAL SINUS SURGERY      Prior to Admission medications   Medication Sig Start Date End Date Taking? Authorizing Provider  albuterol (PROVENTIL HFA;VENTOLIN HFA) 108 (90 Base) MCG/ACT inhaler Inhale 1-2 puffs into the lungs every 4 (four) hours as needed for wheezing or shortness of breath.    [provider]  cetirizine (ZYRTEC) 5 MG tablet Take 5 mg by mouth daily.    [provider]  doxylamine, Sleep, (UNISOM) 25 MG tablet Take 25 mg by mouth at bedtime as needed.    [provider]  EPINEPHrine (EPIPEN 2-PAK) 0.3 mg/0.3 mL IJ SOAJ injection Inject 0.3 mLs (0.3 mg total) into the muscle once. 08/02/14  Hildred Laserherry, Anika, MD  naproxen (NAPROSYN) 500 MG tablet Take 1 tablet (500 mg total) by mouth 2 (two) times daily with a meal. 06/03/17   Lutricia Feiloemer, William P, PA-C  sertraline (ZOLOFT) 25 MG tablet Take 1 tablet (25 mg total) by mouth daily. 07/01/18 07/01/19  Jeanmarie PlantMcShane, James A, MD  traZODone (DESYREL) 50 MG tablet Take 1 tablet (50 mg total) by mouth at bedtime. 07/01/18   Jeanmarie PlantMcShane, James A, MD   triamcinolone (NASACORT) 55 MCG/ACT nasal inhaler Place 2 sprays into both nostrils daily.  09/12/11   [provider]  valACYclovir (VALTREX) 500 MG tablet Take 125 mg by mouth 2 (two) times daily as needed.     [provider]    Allergies  Allergen Reactions  . Amoxicillin Hives  . Bee Venom     Family History  Problem Relation Age of Onset  . Hypertension Mother   . Spina bifida Father        mild  . Spina bifida Brother        mild  . Breast cancer Neg Hx     Social History Social History   Tobacco Use  . Smoking status: Never Smoker  . Smokeless tobacco: Never Used  Substance Use Topics  . Alcohol use: No  . Drug use: No    Review of Systems Constitutional: Negative for fever. ENT: Negative for recent illness/congestion Cardiovascular: Negative for chest pain. Respiratory: Negative for shortness of breath. Gastrointestinal: Negative for abdominal pain positive for nausea. Musculoskeletal: Negative for musculoskeletal complaints Skin: Negative for skin complaints  Neurological: Negative for headache All other ROS negative  ____________________________________________   PHYSICAL EXAM:  VITAL SIGNS: ED Triage Vitals  Enc Vitals Group     BP 07/02/18 1014 131/74     Pulse Rate 07/02/18 1014 78     Resp 07/02/18 1014 (!) 22     Temp 07/02/18 1014 98.2 F (36.8 C)     Temp Source 07/02/18 1014 Oral     SpO2 07/02/18 1014 98 %     Weight 07/02/18 1015 175 lb (79.4 kg)     Height 07/02/18 1015 5\' 4"  (1.626 m)     Head Circumference --      Peak Flow --      Pain Score 07/02/18 1015 6     Pain Loc --      Pain Edu? --      Excl. in [place]? --    Constitutional: Alert and oriented.  Moderately anxious appearing.  Keeps her eyes closed through most of the exam. Eyes: Normal exam ENT      Head: Normocephalic and atraumatic.      Mouth/Throat: Mucous membranes are moist. Cardiovascular: Normal rate, regular rhythm.  Respiratory: Normal  respiratory effort without tachypnea nor retractions. Breath sounds are clear Gastrointestinal: Soft and nontender. No distention.   Musculoskeletal: Nontender with normal range of motion in all extremities.  Neurologic:  Normal speech and language. No gross focal neurologic deficits  Skin:  Skin is warm, dry and intact.  Psychiatric: Anxious.  ____________________________________________    INITIAL IMPRESSION / ASSESSMENT AND PLAN / ED COURSE  Pertinent labs & imaging results that were available during my care of the patient were reviewed by me and considered in my medical decision making (see chart for details).   Patient presents to the emergency department for continued anxiety, symptoms of panic per patient.  Patient was seen in the emergency department yesterday had an overall negative medical work-up, and  was seen by psychiatry and started on Zoloft and trazodone.  Overall patient appears well at this time.  We will treat the patient's anxiety with Ativan and hydroxyzine in the emergency department.  We will continue to closely monitor for improvement.  We will hold off on repeat lab work at this time given the negative work-up yesterday.  Patient is feeling better after medications in the emergency department.  We will discharge with short course of Ativan and hydroxyzine.  Patient will continue to take her Zoloft and trazodone and will follow-up with [facility].  NITIKA JACKOWSKI was evaluated in Emergency Department on 07/02/2018 for the symptoms described in the history of present illness. She was evaluated in the context of the global COVID-19 pandemic, which necessitated consideration that the patient might be at risk for infection with the SARS-CoV-2 virus that causes COVID-19. Institutional protocols and algorithms that pertain to the evaluation of patients at risk for COVID-19 are in a state of rapid change based on information released by regulatory bodies including the CDC and federal and  state organizations. These policies and algorithms were followed during the patient's care in the ED. ___________________________________________   FINAL CLINICAL IMPRESSION(S) / ED DIAGNOSES  Anxiety   Harvest Dark, MD 07/02/18 1216

## 2018-07-02 NOTE — ED Notes (Signed)
Patient discharged home with mother, patient received discharge papers and prescription for Ativan and Vistaril. Patient was not dressed out.  Patient appropriate and cooperative, Denies SI/HI AVH. Vital signs taken. NAD noted.

## 2019-03-06 ENCOUNTER — Ambulatory Visit: Payer: BC Managed Care – PPO
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
## 2020-05-15 IMAGING — CR CHEST - 2 VIEW
2 series · 2 of 2 positions shown · non-contrast
Comparison: None.

CLINICAL DATA: Central chest pain since last night

EXAM:
CHEST - 2 VIEW

[chest pa]
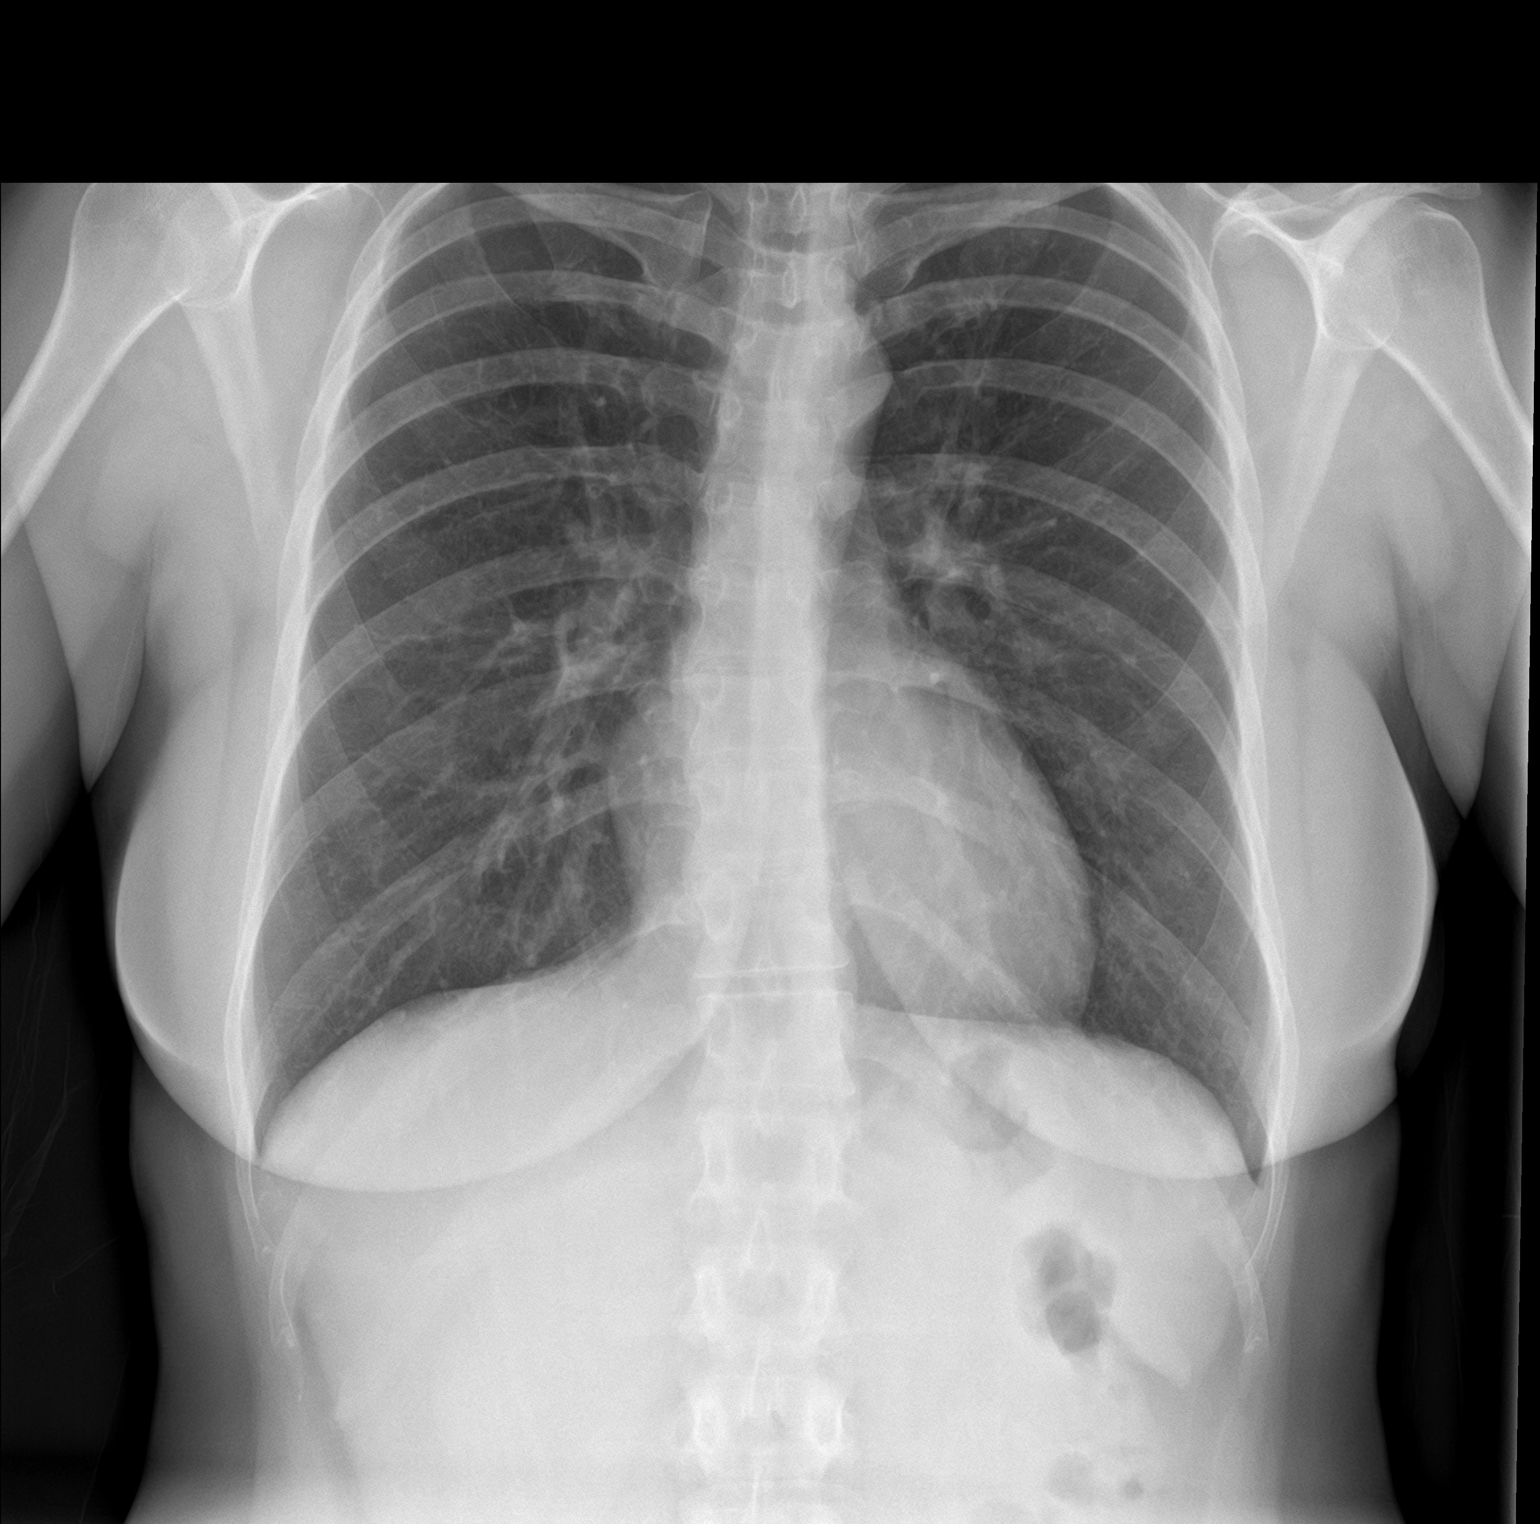

[chest lat]
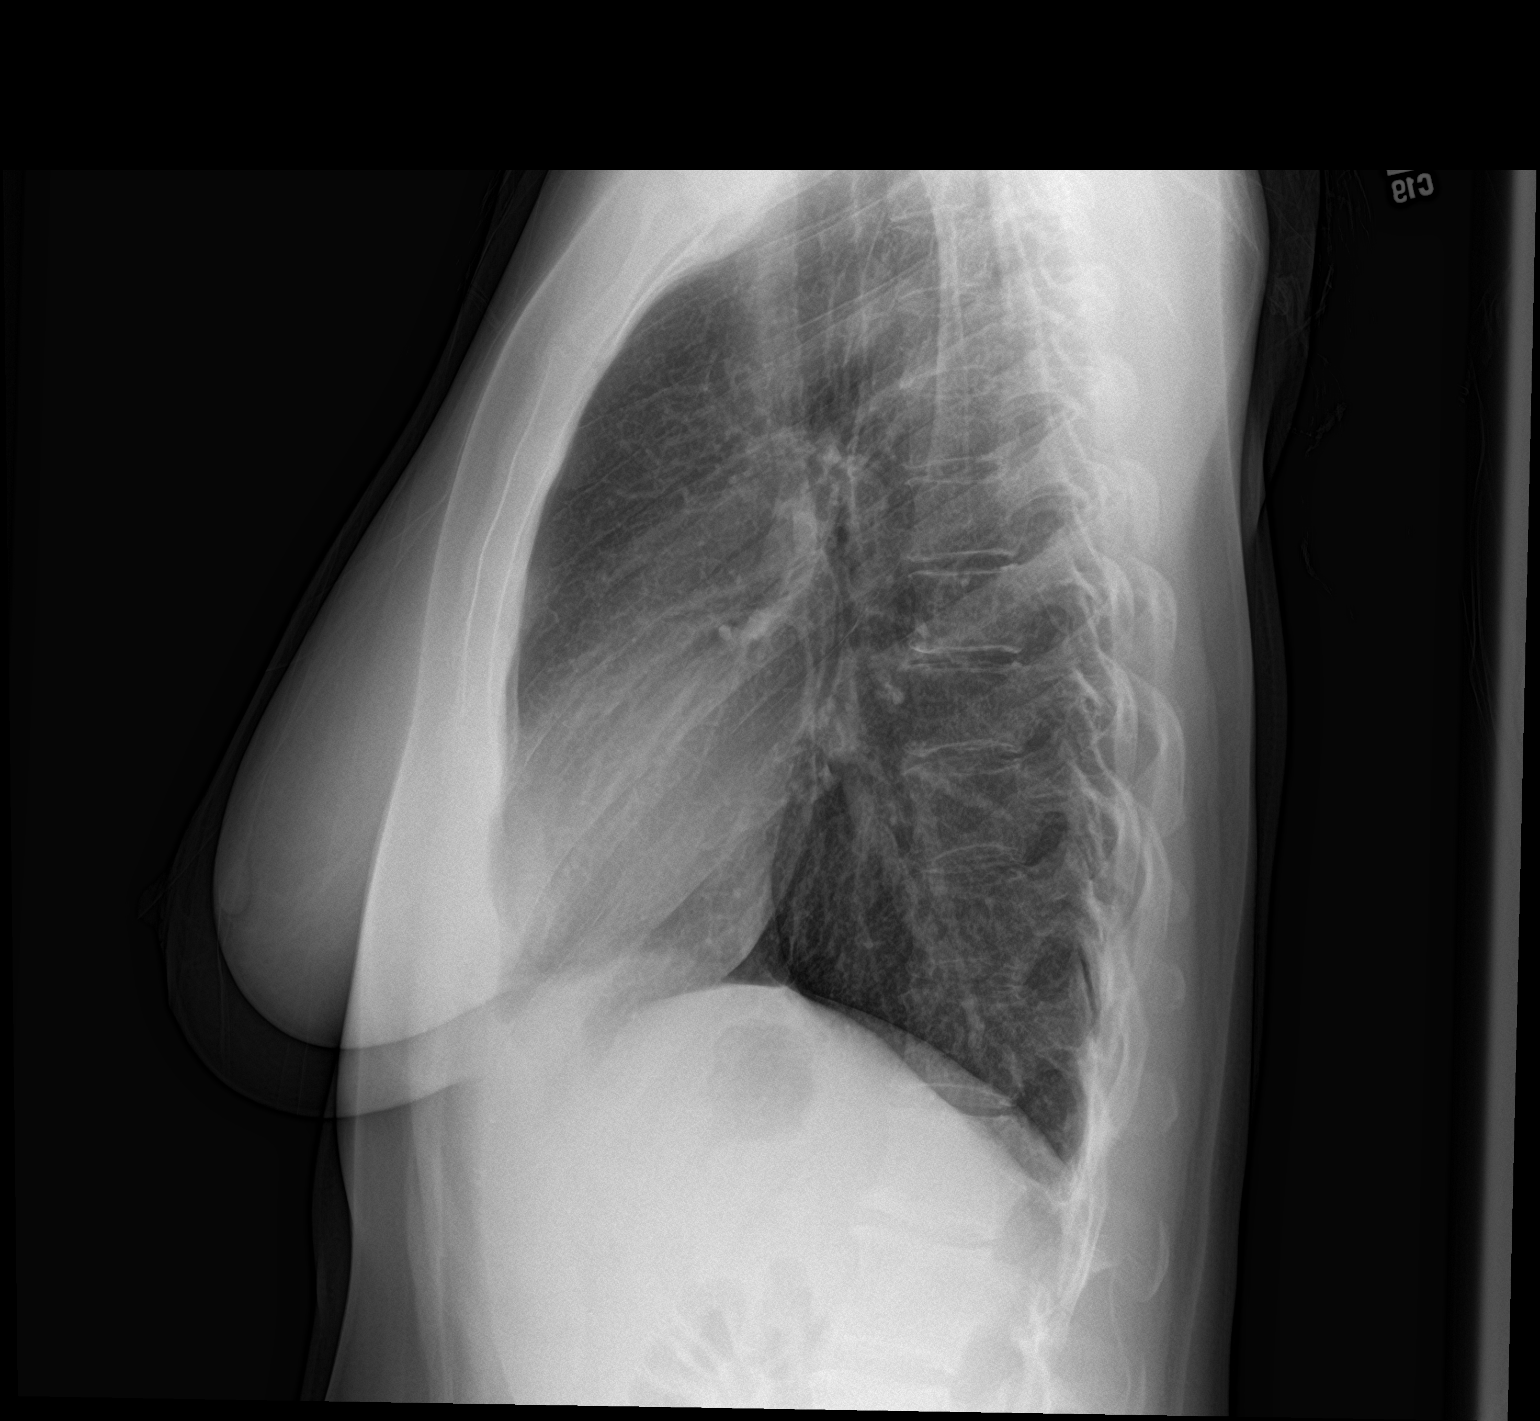

[2 of 2 positions shown; findings below may reference images not displayed]

FINDINGS: The heart size and mediastinal contours are within normal limits.
Both lungs are clear. The visualized skeletal structures are
unremarkable.
IMPRESSION: Negative chest
